# Patient Record
Sex: Male | Born: 1960 | Race: Black or African American | Hispanic: No | Marital: Married | Smoking: Never smoker
Health system: Southern US, Community
[De-identification: ages and names within clinical notes are randomized; demographics above are authoritative.]

## PROBLEM LIST (undated history)

## (undated) DIAGNOSIS — I10 Essential (primary) hypertension: Secondary | ICD-10-CM

## (undated) DIAGNOSIS — E119 Type 2 diabetes mellitus without complications: Secondary | ICD-10-CM

## (undated) DIAGNOSIS — E7800 Pure hypercholesterolemia, unspecified: Secondary | ICD-10-CM

## (undated) DIAGNOSIS — K469 Unspecified abdominal hernia without obstruction or gangrene: Secondary | ICD-10-CM

## (undated) HISTORY — DX: Unspecified abdominal hernia without obstruction or gangrene: K46.9

## (undated) HISTORY — PX: HERNIA REPAIR: SHX51

## (undated) HISTORY — PX: VASECTOMY: SHX75

## (undated) HISTORY — PX: ABDOMINAL SURGERY: SHX537

---

## 2012-07-02 ENCOUNTER — Encounter (INDEPENDENT_AMBULATORY_CARE_PROVIDER_SITE_OTHER): Payer: Self-pay

## 2012-07-03 ENCOUNTER — Ambulatory Visit (INDEPENDENT_AMBULATORY_CARE_PROVIDER_SITE_OTHER): Payer: Self-pay | Admitting: General Surgery

## 2012-07-29 ENCOUNTER — Ambulatory Visit (INDEPENDENT_AMBULATORY_CARE_PROVIDER_SITE_OTHER): Payer: BLUE CROSS/BLUE SHIELD | Admitting: General Surgery

## 2012-07-31 ENCOUNTER — Encounter (INDEPENDENT_AMBULATORY_CARE_PROVIDER_SITE_OTHER): Payer: Self-pay

## 2012-08-05 ENCOUNTER — Ambulatory Visit (INDEPENDENT_AMBULATORY_CARE_PROVIDER_SITE_OTHER): Payer: BLUE CROSS/BLUE SHIELD | Admitting: General Surgery

## 2012-08-05 ENCOUNTER — Encounter (INDEPENDENT_AMBULATORY_CARE_PROVIDER_SITE_OTHER): Payer: Self-pay | Admitting: General Surgery

## 2012-08-05 VITALS — BP 120/70 | HR 91 | Temp 98.1°F | Resp 18 | Ht 67.0 in | Wt 214.2 lb

## 2012-08-05 DIAGNOSIS — K430 Incisional hernia with obstruction, without gangrene: Secondary | ICD-10-CM

## 2012-08-05 NOTE — Patient Instructions (Signed)
The bulge in your abdomen is probably a recurrent, incarcerated incisional hernia. I cannot push it back in and so we need to x-ray this area to be sure that we understand the anatomy.  You will be scheduled for a CT scan of the abdomen and pelvis in the near future.  He will be given an appointment to return to see Dr. Derrell Lolling after the x-rays completed. We will discuss whether you need an operation at that time.    Hernia A hernia occurs when an internal organ pushes out through a weak spot in the abdominal wall. Hernias most commonly occur in the groin and around the navel. Hernias often can be pushed back into place (reduced). Most hernias tend to get worse over time. Some abdominal hernias can get stuck in the opening (irreducible or incarcerated hernia) and cannot be reduced. An irreducible abdominal hernia which is tightly squeezed into the opening is at risk for impaired blood supply (strangulated hernia). A strangulated hernia is a medical emergency. Because of the risk for an irreducible or strangulated hernia, surgery may be recommended to repair a hernia. CAUSES   Heavy lifting.  Prolonged coughing.  Straining to have a bowel movement.  A cut (incision) made during an abdominal surgery. HOME CARE INSTRUCTIONS   Bed rest is not required. You may continue your normal activities.  Avoid lifting more than 10 pounds (4.5 kg) or straining.  Cough gently. If you are a smoker it is best to stop. Even the best hernia repair can break down with the continual strain of coughing. Even if you do not have your hernia repaired, a cough will continue to aggravate the problem.  Do not wear anything tight over your hernia. Do not try to keep it in with an outside bandage or truss. These can damage abdominal contents if they are trapped within the hernia sac.  Eat a normal diet.  Avoid constipation. Straining over long periods of time will increase hernia size and encourage breakdown of  repairs. If you cannot do this with diet alone, stool softeners may be used. SEEK IMMEDIATE MEDICAL CARE IF:   You have a fever.  You develop increasing abdominal pain.  You feel nauseous or vomit.  Your hernia is stuck outside the abdomen, looks discolored, feels hard, or is tender.  You have any changes in your bowel habits or in the hernia that are unusual for you.  You have increased pain or swelling around the hernia.  You cannot push the hernia back in place by applying gentle pressure while lying down. MAKE SURE YOU:   Understand these instructions.  Will watch your condition.  Will get help right away if you are not doing well or get worse. Document Released: 04/22/2005 Document Revised: 07/15/2011 Document Reviewed: 12/10/2007 [facility] Patient Information 2013 [place], [place].

## 2012-08-05 NOTE — Progress Notes (Signed)
Patient ID: Joshua Harrington, male   DOB: 1960-05-09, 52 y.o.   MRN: [id number]  Chief Complaint  Patient presents with  . New Evaluation    eval hernia    HPI Joshua Harrington is a 52 y.o. male.  He is referred by Dr. Greggory Stallion Osei-Bonsu for evaluation of a bulge in the abdominal wall which is probably an incarcerated ventral hernia.  Approximately 15 years ago he underwent an open repair of an abdominal wall hernia with mesh. This was in the midline above the umbilicus. This was done in [place]. He is pretty sure that mesh was used. There are no other details.  He drives a truck for [employer]. He has noticed a baseball-sized bulge to the right of his midline incision for about one year, but denies pain and denies any history of enlarging.  Minimal comorbidities. Obesity. Had a colonoscopy a year ago. He has recently established himself with Dr. Julio Sicks. HPI  Past Medical History  Diagnosis Date  . Hernia     Past Surgical History  Procedure Laterality Date  . Abdominal surgery    . Hernia repair    . Vasectomy      Family History  Problem Relation Age of Onset  . Cancer Mother     Stomach  . Cirrhosis Father     Social History History  Substance Use Topics  . Smoking status: Never Smoker   . Smokeless tobacco: Never Used  . Alcohol Use: No    No Known Allergies  Current Outpatient Prescriptions  Medication Sig Dispense Refill  . fish oil-omega-3 fatty acids 1000 MG capsule Take 1 g by mouth daily. Take 4 daily       No current facility-administered medications for this visit.    Review of Systems Review of Systems  Constitutional: Negative for fever, chills and unexpected weight change.  HENT: Negative for hearing loss, congestion, sore throat, trouble swallowing and voice change.   Eyes: Negative for visual disturbance.  Respiratory: Negative for cough and wheezing.   Cardiovascular: Negative for chest pain, palpitations and leg swelling.  Gastrointestinal: Positive  for abdominal distention. Negative for nausea, vomiting, abdominal pain, diarrhea, constipation, blood in stool, anal bleeding and rectal pain.  Genitourinary: Negative for hematuria and difficulty urinating.  Musculoskeletal: Negative for arthralgias.  Skin: Negative for rash and wound.  Neurological: Negative for seizures, syncope, weakness and headaches.  Hematological: Negative for adenopathy. Does not bruise/bleed easily.  Psychiatric/Behavioral: Negative for confusion.    Blood pressure 120/70, pulse 91, temperature 98.1 F (36.7 C), temperature source Temporal, resp. rate 18, height 5\' 7"  (1.702 m), weight 214 lb 3.2 oz (97.16 kg).  Physical Exam Physical Exam  Constitutional: He is oriented to person, place, and time. He appears well-developed and well-nourished. No distress.  BMI 33.5  HENT:  Head: Normocephalic.  Nose: Nose normal.  Mouth/Throat: No oropharyngeal exudate.  Eyes: Conjunctivae and EOM are normal. Pupils are equal, round, and reactive to light. Right eye exhibits no discharge. Left eye exhibits no discharge. No scleral icterus.  Neck: Normal range of motion. Neck supple. No JVD present. No tracheal deviation present. No thyromegaly present.  Cardiovascular: Normal rate, regular rhythm, normal heart sounds and intact distal pulses.   No murmur heard. Pulmonary/Chest: Effort normal and breath sounds normal. No stridor. No respiratory distress. He has no wheezes. He has no rales. He exhibits no tenderness.  Abdominal: Soft. Bowel sounds are normal. He exhibits no distension and no mass. There is no tenderness.  There is no rebound and no guarding.    Baseball-sized bulge in the mid epigastrium to the right of a midline incision. Skin otherwise healthy. Nontender. Nonreducible. Umbilicus normal. No inguinal mass or hernia.  Musculoskeletal: Normal range of motion. He exhibits no edema and no tenderness.  Lymphadenopathy:    He has no cervical adenopathy.    Neurological: He is alert and oriented to person, place, and time. He has normal reflexes. Coordination normal.  Skin: Skin is warm and dry. No rash noted. He is not diaphoretic. No erythema. No pallor.  Psychiatric: He has a normal mood and affect. His behavior is normal. Judgment and thought content normal.    Data Reviewed Office notes from [facility]  Assessment    Abdominal wall mass, probable incarcerated, recurrent incisional hernia  History open repair epigastric hernia with mesh  Obesity     Plan    Because of the prior history of a mesh repair, and because this is not reducible, we will schedule for CT scan of abdomen and pelvis with contrast to better define the anatomy.  He was also advised to lose weight and to discuss that with his primary care physician.  Return to see me in 2 weeks at the CT scan is done. I told him that this may require another operation to repair the hernia.        Angelia Mould. Derrell Lolling, M.D., [facility] General and Minimally invasive Surgery Breast and Colorectal Surgery Office:   [phone] Pager:   [phone]  08/05/2012, 4:46 PM

## 2012-08-10 ENCOUNTER — Other Ambulatory Visit: Payer: Self-pay

## 2012-08-11 ENCOUNTER — Other Ambulatory Visit: Payer: Self-pay

## 2012-08-12 ENCOUNTER — Other Ambulatory Visit: Payer: Self-pay

## 2012-08-18 ENCOUNTER — Telehealth (INDEPENDENT_AMBULATORY_CARE_PROVIDER_SITE_OTHER): Payer: Self-pay

## 2012-08-18 NOTE — Telephone Encounter (Signed)
Facility calling requesting CT abd/pelvis order be faxed to them.  Pt has chosen [facility] instead of [facility].  Order was faxed and this CMA requested that results be faxed to Dr. Derrell Lolling, and the pt be given a CD to bring with him to his appt.

## 2012-08-24 ENCOUNTER — Encounter (INDEPENDENT_AMBULATORY_CARE_PROVIDER_SITE_OTHER): Payer: Self-pay

## 2012-08-27 ENCOUNTER — Encounter (INDEPENDENT_AMBULATORY_CARE_PROVIDER_SITE_OTHER): Payer: BLUE CROSS/BLUE SHIELD | Admitting: General Surgery

## 2012-09-16 ENCOUNTER — Ambulatory Visit (INDEPENDENT_AMBULATORY_CARE_PROVIDER_SITE_OTHER): Payer: BLUE CROSS/BLUE SHIELD | Admitting: General Surgery

## 2012-09-16 ENCOUNTER — Encounter (INDEPENDENT_AMBULATORY_CARE_PROVIDER_SITE_OTHER): Payer: Self-pay | Admitting: General Surgery

## 2012-09-16 VITALS — BP 130/82 | HR 60 | Temp 97.0°F | Resp 18 | Ht 67.0 in | Wt 213.0 lb

## 2012-09-16 DIAGNOSIS — K430 Incisional hernia with obstruction, without gangrene: Secondary | ICD-10-CM

## 2012-09-16 NOTE — Progress Notes (Signed)
Patient ID: Joshua Harrington, male   DOB: [birth year], 52 y.o.   MRN: [id number]  Chief Complaint  Patient presents with  . Follow-up    HPI [name] is a 52 y.o. male.  He returns to talk with me about his ventral hernia.  Recall that he was referred initially by Dr. Loletta Parish for evaluation of a bulge in the abdominal wall. 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 Denies pain nausea or vomiting. History is significant for an open repair of abdominal wall hernia with mesh 15 years ago in [place]. He doesn't really know the details of takes mesh was used. He had a colonoscopy a year ago. Mild obesity but otherwise no problems.  He had a CT scan at [facility] imaging recently. This shows a ventral wall hernia in the anterior abdominal wall to the right of the midline with herniation of omental fat. There is no bowel loop herniation there was no bowel function. There was a small cyst in the liver. I have reviewed the films on disc and the report.I have discussed this with him.  I have told him that there is no emergency, but that the hernia is incarcerated. I told him that it might begin to cause pain. I've told her that it might enlarge and at that the intestine might herniate into this at some point. He says that he would like to have this repaired electively before it gets much worse.  HPI  Past Medical History  Diagnosis Date  . Hernia     Past Surgical History  Procedure Laterality Date  . Abdominal surgery    . Hernia repair    . Vasectomy      Family History  Problem Relation Age of Onset  . Cancer Mother     Stomach  . Cirrhosis Father     Social History History  Substance Use Topics  . Smoking status: Never Smoker   . Smokeless tobacco: Never Used  . Alcohol Use: No    No Known Allergies  Current Outpatient Prescriptions  Medication Sig Dispense Refill  . fish  oil-omega-3 fatty acids 1000 MG capsule Take 1 g by mouth daily. Take 4 daily       No current facility-administered medications for this visit.    Review of Systems Review of Systems  Constitutional: Negative for fever, chills and unexpected weight change.  HENT: Negative for hearing loss, congestion, sore throat, trouble swallowing and voice change.   Eyes: Negative for visual disturbance.  Respiratory: Negative for cough and wheezing.   Cardiovascular: Negative for chest pain, palpitations and leg swelling.  Gastrointestinal: Negative for nausea, vomiting, abdominal pain, diarrhea, constipation, blood in stool, abdominal distention, anal bleeding and rectal pain.  Genitourinary: Negative for hematuria and difficulty urinating.  Musculoskeletal: Negative for arthralgias.  Skin: Negative for rash and wound.  Neurological: Negative for seizures, syncope, weakness and headaches.  Hematological: Negative for adenopathy. Does not bruise/bleed easily.  Psychiatric/Behavioral: Negative for confusion.    Blood pressure 130/82, pulse 60, temperature 97 F (36.1 C), temperature source Oral, resp. rate 18, height 5\' 7"  (1.702 m), weight 213 lb (96.616 kg).  Physical Exam Physical Exam  Constitutional: He is oriented to person, place, and time. He appears well-developed and well-nourished. No distress.  BMI 33.5  HENT:  Head: Normocephalic.  Nose: Nose normal.  Mouth/Throat: No oropharyngeal exudate.  Eyes: Conjunctivae and EOM are normal. Pupils are equal, round, and reactive to light. Right eye exhibits no discharge. Left eye exhibits no discharge. No scleral icterus.  Neck: Normal range of motion. Neck supple. No JVD present. No tracheal deviation present. No thyromegaly present.  Cardiovascular: Normal rate, regular rhythm, normal heart sounds and intact distal pulses.   No murmur heard. Pulmonary/Chest: Effort normal and breath sounds normal. No stridor. No respiratory distress. He has  no wheezes. He has no rales. He exhibits no tenderness.  Abdominal: Soft. Bowel sounds are normal. He exhibits no distension and no mass. There is no tenderness. There is no rebound and no guarding.    There is a bulge the size of a baseball lower larger in the mid epigastrium projecting to the right of the midline incision but probably related to the midline incision. This is smooth. Not tender particularly. I have tried forcably  to reduce this but cannot reduce it.  Musculoskeletal: Normal range of motion. He exhibits no edema and no tenderness.  Lymphadenopathy:    He has no cervical adenopathy.  Neurological: He is alert and oriented to person, place, and time. He has normal reflexes. Coordination normal.  Skin: Skin is warm and dry. No rash noted. He is not diaphoretic. No erythema. No pallor.  Psychiatric: He has a normal mood and affect. His behavior is normal. Judgment and thought content normal.    Data Reviewed My records. Recent CT scan  Assessment    Incarcerated, recurrent incisional hernia.  Obesity  History open repair epigastric hernia with mesh, details unknown     Plan    The patient requested we proceed with scheduling of repair and I think that is appropriate.  He willscheduled for open repair of recurrent incisional hernia with mesh in the near future. This may or may not require component separation advancement flaps.  I have discussed indications, details, techniques, and numerous risks of the surgery with him. I have given her patient information booklet with this information as well and have reviewed the images and the risks with him. He understands all these issues. All his questions were answered. He agrees with this plan.        Angelia Mould. Derrell Lolling, M.D., [facility] General and Minimally invasive Surgery Breast and Colorectal Surgery Office:   [phone] Pager:   [phone]  09/16/2012, 2:33 PM

## 2012-09-16 NOTE — Patient Instructions (Signed)
Your CT scan confirms that you have a recurrent incisional hernia containing fatty tissue and omentum. There is no intestine trapped in there at this time.  We have discussed different options for management of this. You have decided to go ahead and have an operation to repair this at this time. That is appropriate.  You will be scheduled for open repair of incarcerated recurrent incisional hernia with mesh in the near future.

## 2012-09-23 ENCOUNTER — Encounter (INDEPENDENT_AMBULATORY_CARE_PROVIDER_SITE_OTHER): Payer: Self-pay

## 2014-11-21 ENCOUNTER — Encounter (HOSPITAL_BASED_OUTPATIENT_CLINIC_OR_DEPARTMENT_OTHER): Payer: Self-pay | Admitting: *Deleted

## 2014-11-21 ENCOUNTER — Emergency Department (HOSPITAL_BASED_OUTPATIENT_CLINIC_OR_DEPARTMENT_OTHER)
Admission: EM | Admit: 2014-11-21 | Discharge: 2014-11-21 | Disposition: A | Discharge status: Home / Self Care | Payer: BLUE CROSS/BLUE SHIELD | Attending: Emergency Medicine | Admitting: Emergency Medicine

## 2014-11-21 DIAGNOSIS — M545 Low back pain, unspecified: Secondary | ICD-10-CM

## 2014-11-21 DIAGNOSIS — Z8719 Personal history of other diseases of the digestive system: Secondary | ICD-10-CM | POA: Diagnosis not present

## 2014-11-21 DIAGNOSIS — Z79899 Other long term (current) drug therapy: Secondary | ICD-10-CM | POA: Insufficient documentation

## 2014-11-21 DIAGNOSIS — E119 Type 2 diabetes mellitus without complications: Secondary | ICD-10-CM | POA: Insufficient documentation

## 2014-11-21 DIAGNOSIS — M62830 Muscle spasm of back: Secondary | ICD-10-CM | POA: Diagnosis not present

## 2014-11-21 DIAGNOSIS — M549 Dorsalgia, unspecified: Secondary | ICD-10-CM | POA: Diagnosis present

## 2014-11-21 DIAGNOSIS — E780 Pure hypercholesterolemia: Secondary | ICD-10-CM | POA: Diagnosis not present

## 2014-11-21 HISTORY — DX: Type 2 diabetes mellitus without complications: E11.9

## 2014-11-21 HISTORY — DX: Pure hypercholesterolemia, unspecified: E78.00

## 2014-11-21 MED ORDER — HYDROMORPHONE HCL 1 MG/ML IJ SOLN
1.0000 mg | Freq: Once | INTRAMUSCULAR | Status: AC
  Administered 2014-11-21: 1 mg via INTRAMUSCULAR
  Filled 2014-11-21: qty 1

## 2014-11-21 MED ORDER — HYDROCODONE-ACETAMINOPHEN 5-325 MG PO TABS: 1.0000 | ORAL_TABLET | Freq: Four times a day (QID) | ORAL | Status: DC | PRN

## 2014-11-21 MED ORDER — CYCLOBENZAPRINE HCL 10 MG PO TABS
10.0000 mg | ORAL_TABLET | Freq: Once | ORAL | Status: AC
  Administered 2014-11-21: 10 mg via ORAL
  Filled 2014-11-21: qty 1

## 2014-11-21 MED ORDER — IBUPROFEN 800 MG PO TABS: 800.0000 mg | ORAL_TABLET | Freq: Three times a day (TID) | ORAL | Status: DC

## 2014-11-21 NOTE — Discharge Instructions (Signed)
Back Injury Prevention °Back injuries can be extremely painful and difficult to heal. After having one back injury, you are much more likely to experience another later on. It is important to learn how to avoid injuring or re-injuring your back. The following tips can help you to prevent a back injury. °PHYSICAL FITNESS °· Exercise regularly and try to develop good tone in your abdominal muscles. Your abdominal muscles provide a lot of the support needed by your back. °· Do aerobic exercises (walking, jogging, biking, swimming) regularly. °· Do exercises that increase balance and strength (tai chi, yoga) regularly. This can decrease your risk of falling and injuring your back. °· Stretch before and after exercising. °· Maintain a healthy weight. The more you weigh, the more stress is placed on your back. For every pound of weight, 10 times that amount of pressure is placed on the back. °DIET °· Talk to your caregiver about how much calcium and vitamin D you need per day. These nutrients help to prevent weakening of the bones (osteoporosis). Osteoporosis can cause broken (fractured) bones that lead to back pain. °· Include good sources of calcium in your diet, such as dairy products, green, leafy vegetables, and products with calcium added (fortified). °· Include good sources of vitamin D in your diet, such as milk and foods that are fortified with vitamin D. °· Consider taking a nutritional supplement or a multivitamin if needed. °· Stop smoking if you smoke. °POSTURE °· Sit and stand up straight. Avoid leaning forward when you sit or hunching over when you stand. °· Choose chairs with good low back (lumbar) support. °· If you work at a desk, sit close to your work so you do not need to lean over. Keep your chin tucked in. Keep your neck drawn back and elbows bent at a right angle. Your arms should look like the letter "L." °· Sit high and close to the steering wheel when you drive. Add a lumbar support to your car  seat if needed. °· Avoid sitting or standing in one position for too long. Take breaks to get up, stretch, and walk around at least once every hour. Take breaks if you are driving for long periods of time. °· Sleep on your side with your knees slightly bent, or sleep on your back with a pillow under your knees. Do not sleep on your stomach. °LIFTING, TWISTING, AND REACHING °· Avoid heavy lifting, especially repetitive lifting. If you must do heavy lifting: °· Stretch before lifting. °· Work slowly. °· Rest between lifts. °· Use carts and dollies to move objects when possible. °· Make several small trips instead of carrying 1 heavy load. °· Ask for help when you need it. °· Ask for help when moving big, awkward objects. °· Follow these steps when lifting: °· Stand with your feet shoulder-width apart. °· Get as close to the object as you can. Do not try to pick up heavy objects that are far from your body. °· Use handles or lifting straps if they are available. °· Bend at your knees. Squat down, but keep your heels off the floor. °· Keep your shoulders pulled back, your chin tucked in, and your back straight. °· Lift the object slowly, tightening the muscles in your legs, abdomen, and buttocks. Keep the object as close to the center of your body as possible. °· When you put a load down, use these same guidelines in reverse. °· Do not: °· Lift the object above your waist. °·   Twist at the waist while lifting or carrying a load. Move your feet if you need to turn, not your waist.  Bend over without bending at your knees.  Avoid reaching over your head, across a table, or for an object on a high surface. OTHER TIPS  Avoid wet floors and keep sidewalks clear of ice to prevent falls.  Do not sleep on a mattress that is too soft or too hard.  Keep items that are used frequently within easy reach.  Put heavier objects on shelves at waist level and lighter objects on lower or higher shelves.  Find ways to  decrease your stress, such as exercise, massage, or relaxation techniques. Stress can build up in your muscles. Tense muscles are more vulnerable to injury.  Seek treatment for depression or anxiety if needed. These conditions can increase your risk of developing back pain. SEEK MEDICAL CARE IF:  You injure your back.  You have questions about diet, exercise, or other ways to prevent back injuries. MAKE SURE YOU:  Understand these instructions.  Will watch your condition.  Will get help right away if you are not doing well or get worse. Document Released: 05/30/2004 Document Revised: 07/15/2011 Document Reviewed: 06/03/2011 [employer] Patient Information 2015 [employer]. This information is not intended to replace advice given to you by your health care provider. Make sure you discuss any questions you have with your health care provider.  Back Pain, Adult Low back pain is very common. About 1 in 5 people have back pain.The cause of low back pain is rarely dangerous. The pain often gets better over time.About half of people with a sudden onset of back pain feel better in just 2 weeks. About 8 in 10 people feel better by 6 weeks.  CAUSES Some common causes of back pain include:  Strain of the muscles or ligaments supporting the spine.  Wear and tear (degeneration) of the spinal discs.  Arthritis.  Direct injury to the back. DIAGNOSIS Most of the time, the direct cause of low back pain is not known.However, back pain can be treated effectively even when the exact cause of the pain is unknown.Answering your caregiver's questions about your overall health and symptoms is one of the most accurate ways to make sure the cause of your pain is not dangerous. If your caregiver needs more information, he or she may order lab work or imaging tests (X-rays or MRIs).However, even if imaging tests show changes in your back, this usually does not require surgery. HOME CARE INSTRUCTIONS For  many people, back pain returns.Since low back pain is rarely dangerous, it is often a condition that people can learn to manageon their own.   Remain active. It is stressful on the back to sit or stand in one place. Do not sit, drive, or stand in one place for more than 30 minutes at a time. Take short walks on level surfaces as soon as pain allows.Try to increase the length of time you walk each day.  Do not stay in bed.Resting more than 1 or 2 days can delay your recovery.  Do not avoid exercise or work.Your body is made to move.It is not dangerous to be active, even though your back may hurt.Your back will likely heal faster if you return to being active before your pain is gone.  Pay attention to your body when you bend and lift. Many people have less discomfortwhen lifting if they bend their knees, keep the load close to their bodies,and   avoid twisting. Often, the most comfortable positions are those that put less stress on your recovering back.  Find a comfortable position to sleep. Use a firm mattress and lie on your side with your knees slightly bent. If you lie on your back, put a pillow under your knees.  Only take over-the-counter or prescription medicines as directed by your caregiver. Over-the-counter medicines to reduce pain and inflammation are often the most helpful.Your caregiver may prescribe muscle relaxant drugs.These medicines help dull your pain so you can more quickly return to your normal activities and healthy exercise.  Put ice on the injured area.  Put ice in a plastic bag.  Place a towel between your skin and the bag.  Leave the ice on for 15-20 minutes, 03-04 times a day for the first 2 to 3 days. After that, ice and heat may be alternated to reduce pain and spasms.  Ask your caregiver about trying back exercises and gentle massage. This may be of some benefit.  Avoid feeling anxious or stressed.Stress increases muscle tension and can worsen back  pain.It is important to recognize when you are anxious or stressed and learn ways to manage it.Exercise is a great option. SEEK MEDICAL CARE IF:  You have pain that is not relieved with rest or medicine.  You have pain that does not improve in 1 week.  You have new symptoms.  You are generally not feeling well. SEEK IMMEDIATE MEDICAL CARE IF:   You have pain that radiates from your back into your legs.  You develop new bowel or bladder control problems.  You have unusual weakness or numbness in your arms or legs.  You develop nausea or vomiting.  You develop abdominal pain.  You feel faint. Document Released: 04/22/2005 Document Revised: 10/22/2011 Document Reviewed: 08/24/2013 [facility] Patient Information 2015 [place], [place]. This information is not intended to replace advice given to you by your health care provider. Make sure you discuss any questions you have with your health care provider.  Musculoskeletal Pain Musculoskeletal pain is muscle and boney aches and pains. These pains can occur in any part of the body. Your caregiver may treat you without knowing the cause of the pain. They may treat you if blood or urine tests, X-rays, and other tests were normal.  CAUSES There is often not a definite cause or reason for these pains. These pains may be caused by a type of germ (virus). The discomfort may also come from overuse. Overuse includes working out too hard when your body is not fit. Boney aches also come from weather changes. Bone is sensitive to atmospheric pressure changes. HOME CARE INSTRUCTIONS   Ask when your test results will be ready. Make sure you get your test results.  Only take over-the-counter or prescription medicines for pain, discomfort, or fever as directed by your caregiver. If you were given medications for your condition, do not drive, operate machinery or power tools, or sign legal documents for 24 hours. Do not drink alcohol. Do not take sleeping  pills or other medications that may interfere with treatment.  Continue all activities unless the activities cause more pain. When the pain lessens, slowly resume normal activities. Gradually increase the intensity and duration of the activities or exercise.  During periods of severe pain, bed rest may be helpful. Lay or sit in any position that is comfortable.  Putting ice on the injured area.  Put ice in a bag.  Place a towel between your skin and the bag.  Leave the ice on for 15 to 20 minutes, 3 to 4 times a day.  Follow up with your caregiver for continued problems and no reason can be found for the pain. If the pain becomes worse or does not go away, it may be necessary to repeat tests or do additional testing. Your caregiver may need to look further for a possible cause. SEEK IMMEDIATE MEDICAL CARE IF:  You have pain that is getting worse and is not relieved by medications.  You develop chest pain that is associated with shortness or breath, sweating, feeling sick to your stomach (nauseous), or throw up (vomit).  Your pain becomes localized to the abdomen.  You develop any new symptoms that seem different or that concern you. MAKE SURE YOU:   Understand these instructions.  Will watch your condition.  Will get help right away if you are not doing well or get worse. Document Released: 04/22/2005 Document Revised: 07/15/2011 Document Reviewed: 12/25/2012 [facility] Patient Information 2015 [place], [place]. This information is not intended to replace advice given to you by your health care provider. Make sure you discuss any questions you have with your health care provider.

## 2014-11-21 NOTE — ED Provider Notes (Signed)
CSN: [id number]   Arrival date & time 11/21/14 1321  History  This chart was scribed for  Elwin MochaBlair Samirah Scarpati, MD by Bethel BornBritney McCollum, ED Scribe. This patient was seen in room MH02/MH02 and the patient's care was started at 2:59 PM.  Chief Complaint  Patient presents with  . Back Pain    HPI Patient is a 54 y.o. male presenting with back pain. The history is provided by the patient. No language interpreter was used.  Back Pain Location:  Lumbar spine Radiates to:  Does not radiate Pain severity:  Severe Onset quality:  Sudden Timing:  Constant Progression:  Worsening Context: recent injury   Relieved by:  Nothing Worsened by:  Movement and bending Associated symptoms: no bladder incontinence, no bowel incontinence, no fever, no tingling and no weakness    Joshua Harrington is a 54 y.o. male who presents to the Emergency Department complaining of constant lower back pain with sudden onset yesterday while changing a tire. Pt notes feeling like he pulled a muscle. He rates the pain 9/10 in severity. Fast movement and bending exacerbate the pain. Aleve provided insufficient pain relief PTA. Pt denies bowel or bladder incontinence, numbness or weakness in the lower extremities, and fever. He is having no difficulty ambulating.   Past Medical History  Diagnosis Date  . Hernia   . Diabetes mellitus without complication   . High cholesterol     Past Surgical History  Procedure Laterality Date  . Abdominal surgery    . Hernia repair    . Vasectomy      Family History  Problem Relation Age of Onset  . Cancer Mother     Stomach  . Cirrhosis Father     History  Substance Use Topics  . Smoking status: Never Smoker   . Smokeless tobacco: Never Used  . Alcohol Use: No     Review of Systems  Constitutional: Negative for fever.  Gastrointestinal: Negative for bowel incontinence.  Genitourinary: Negative for bladder incontinence.  Musculoskeletal: Positive for back pain.  Neurological: Negative  for tingling and weakness.  All other systems reviewed and are negative.   Home Medications   Prior to Admission medications   Medication Sig Start Date End Date Taking? Authorizing Provider  atorvastatin (LIPITOR) 10 MG tablet Take 10 mg by mouth daily.   Yes Historical Provider, MD  METFORMIN HCL PO Take by mouth.   Yes Historical Provider, MD  fish oil-omega-3 fatty acids 1000 MG capsule Take 1 g by mouth daily. Take 4 daily    Historical Provider, MD    Allergies  Review of patient's allergies indicates no known allergies.  Triage Vitals: BP 138/83 mmHg  Pulse 88  Temp(Src) 98.3 F (36.8 C) (Oral)  Resp 16  Ht 5\' 7"  (1.702 m)  Wt 207 lb (93.895 kg)  BMI 32.41 kg/m2  SpO2 99%  Physical Exam  Constitutional: He is oriented to person, place, and time. He appears well-developed and well-nourished. No distress.  HENT:  Head: Normocephalic and atraumatic.  Mouth/Throat: No oropharyngeal exudate.  Eyes: EOM are normal. Pupils are equal, round, and reactive to light.  Neck: Normal range of motion. Neck supple.  Cardiovascular: Normal rate and regular rhythm.  Exam reveals no friction rub.   No murmur heard. Pulmonary/Chest: Effort normal and breath sounds normal. No respiratory distress. He has no wheezes. He has no rales.  Abdominal: He exhibits no distension. There is no tenderness. There is no rebound.  Musculoskeletal: Normal range of motion. He  exhibits no edema.       Lumbar back: He exhibits spasm (Right lower lumbar).  Neurological: He is alert and oriented to person, place, and time.  Skin: He is not diaphoretic.  Nursing note and vitals reviewed.   ED Course  Procedures   DIAGNOSTIC STUDIES: Oxygen Saturation is 99% on RA, normal by my interpretation.    COORDINATION OF CARE: 3:02 PM Discussed treatment plan which includes Dilaudid and Flexeril with pt at bedside and pt agreed to plan.  Labs Review- Labs Reviewed - No data to display  Imaging Review No  results found.  EKG Interpretation None      MDM   Final diagnoses:  Right-sided low back pain without sciatica    54 year old male here with right lower back pain. Constant since yesterday after changing a tire. No fevers, red flags such as bowel incontinence, bladder incontinence, weakness, numbness, fever. He is well appearing. Here he has a right lower lumbar spasm noted on exam. Pain does not radiate. No belly pain. Vitals are stable. Exam and clinical history consistent with right lower lumbar strain. We'll give Dilaudid and Flexeril here. Plan for discharge with prescription for Vicodin and Flexeril. Feeling better after pain meds. Stable for discharge.    I personally performed the services described in this documentation, which was scribed in my presence. The recorded information has been reviewed and is accurate.     Elwin Mocha, MD 11/21/14 1535

## 2014-11-21 NOTE — ED Notes (Signed)
Lower back pain since changing a tire yesterday.

## 2018-03-18 ENCOUNTER — Encounter: Payer: Self-pay | Admitting: Podiatry

## 2018-03-18 ENCOUNTER — Ambulatory Visit (INDEPENDENT_AMBULATORY_CARE_PROVIDER_SITE_OTHER): Payer: Managed Care, Other (non HMO) | Admitting: Podiatry

## 2018-03-18 VITALS — BP 150/101

## 2018-03-18 DIAGNOSIS — B351 Tinea unguium: Secondary | ICD-10-CM | POA: Diagnosis not present

## 2018-03-18 DIAGNOSIS — M79674 Pain in right toe(s): Secondary | ICD-10-CM | POA: Diagnosis not present

## 2018-03-18 DIAGNOSIS — B353 Tinea pedis: Secondary | ICD-10-CM

## 2018-03-18 DIAGNOSIS — M79675 Pain in left toe(s): Secondary | ICD-10-CM | POA: Diagnosis not present

## 2018-03-18 DIAGNOSIS — Z9109 Other allergy status, other than to drugs and biological substances: Secondary | ICD-10-CM | POA: Insufficient documentation

## 2018-03-18 MED ORDER — KETOCONAZOLE 2 % EX CREA: 1.0000 "application " | TOPICAL_CREAM | Freq: Every day | CUTANEOUS | 1 refills | Status: DC

## 2018-03-18 NOTE — Patient Instructions (Addendum)
Athlete's Foot Athlete's foot (tinea pedis) is a fungal infection of the skin on the feet. It often occurs on the skin that is between or underneath the toes. It can also occur on the soles of the feet. The infection can spread from person to person (is contagious). What are the causes? Athlete's foot is caused by a fungus. This fungus grows in warm, moist places. Most people get athlete's foot by sharing shower stalls, towels, and wet floors with someone who is infected. Not washing your feet or changing your socks often enough can contribute to athlete's foot. What increases the risk? This condition is more likely to develop in:  Men.  People who have a weak body defense system (immune system).  People who have diabetes.  People who use public showers, such as at a gym.  People who wear heavy-duty shoes, such as industrial or military shoes.  Seasons with warm, humid weather.  What are the signs or symptoms? Symptoms of this condition include:  Itchy areas between the toes or on the soles of the feet.  White, flaky, or scaly areas between the toes or on the soles of the feet.  Very itchy small blisters between the toes or on the soles of the feet.  Small cuts on the skin. These cuts can become infected.  Thick or discolored toenails.  How is this diagnosed? This condition is diagnosed with a medical history and physical exam. Your health care provider may also take a skin or toenail sample to be examined. How is this treated? Treatment for this condition includes antifungal medicines. These may be applied as powders, ointments, or creams. In severe cases, an oral antifungal medicine may be given. Follow these instructions at home:  Apply or take over-the-counter and prescription medicines only as told by your health care provider.  Keep all follow-up visits as told by your health care provider. This is important.  Do not scratch your feet.  Keep your feet dry: ? Wear  cotton or wool socks. Change your socks every day or if they become wet. ? Wear shoes that allow air to circulate, such as sandals or canvas tennis shoes.  Wash and dry your feet: ? Every day or as told by your health care provider. ? After exercising. ? Including the area between your toes.  Do not share towels, nail clippers, or other personal items that touch your feet with others.  If you have diabetes, keep your blood sugar under control. How is this prevented?  Do not share towels.  Wear sandals in wet areas, such as locker rooms and shared showers.  Keep your feet dry: ? Wear cotton or wool socks. Change your socks every day or if they become wet. ? Wear shoes that allow air to circulate, such as sandals or canvas tennis shoes.  Wash and dry your feet after exercising. Pay attention to the area between your toes. Contact a health care provider if:  You have a fever.  You have swelling, soreness, warmth, or redness in your foot.  You are not getting better with treatment.  Your symptoms get worse.  You have new symptoms. This information is not intended to replace advice given to you by your health care provider. Make sure you discuss any questions you have with your health care provider. Document Released: 04/19/2000 Document Revised: 09/28/2015 Document Reviewed: 10/24/2014 [employer] Interactive Patient Education  2018 [employer] Diabetes and Foot Care Diabetes may cause you to have problems because of poor   blood supply (circulation) to your feet and legs. This may cause the skin on your feet to become thinner, break easier, and heal more slowly. Your skin may become dry, and the skin may peel and crack. You may also have nerve damage in your legs and feet causing decreased feeling in them. You may not notice minor injuries to your feet that could lead to infections or more serious problems. Taking care of your feet is one of the most important things you can do for  yourself. Follow these instructions at home:  Wear shoes at all times, even in the house. Do not go barefoot. Bare feet are easily injured.  Check your feet daily for blisters, cuts, and redness. If you cannot see the bottom of your feet, use a mirror or ask someone for help.  Wash your feet with warm water (do not use hot water) and mild soap. Then pat your feet and the areas between your toes until they are completely dry. Do not soak your feet as this can dry your skin.  Apply a moisturizing lotion or petroleum jelly (that does not contain alcohol and is unscented) to the skin on your feet and to dry, brittle toenails. Do not apply lotion between your toes.  Trim your toenails straight across. Do not dig under them or around the cuticle. File the edges of your nails with an emery board or nail file.  Do not cut corns or calluses or try to remove them with medicine.  Wear clean socks or stockings every day. Make sure they are not too tight. Do not wear knee-high stockings since they may decrease blood flow to your legs.  Wear shoes that fit properly and have enough cushioning. To break in new shoes, wear them for just a few hours a day. This prevents you from injuring your feet. Always look in your shoes before you put them on to be sure there are no objects inside.  Do not cross your legs. This may decrease the blood flow to your feet.  If you find a minor scrape, cut, or break in the skin on your feet, keep it and the skin around it clean and dry. These areas may be cleansed with mild soap and water. Do not cleanse the area with peroxide, alcohol, or iodine.  When you remove an adhesive bandage, be sure not to damage the skin around it.  If you have a wound, look at it several times a day to make sure it is healing.  Do not use heating pads or hot water bottles. They may burn your skin. If you have lost feeling in your feet or legs, you may not know it is happening until it is too  late.  Make sure your health care provider performs a complete foot exam at least annually or more often if you have foot problems. Report any cuts, sores, or bruises to your health care provider immediately. Contact a health care provider if:  You have an injury that is not healing.  You have cuts or breaks in the skin.  You have an ingrown nail.  You notice redness on your legs or feet.  You feel burning or tingling in your legs or feet.  You have pain or cramps in your legs and feet.  Your legs or feet are numb.  Your feet always feel cold. Get help right away if:  There is increasing redness, swelling, or pain in or around a wound.  There is a   red line that goes up your leg.  Pus is coming from a wound.  You develop a fever or as directed by your health care provider.  You notice a bad smell coming from an ulcer or wound. This information is not intended to replace advice given to you by your health care provider. Make sure you discuss any questions you have with your health care provider. Document Released: 04/19/2000 Document Revised: 09/28/2015 Document Reviewed: 09/29/2012 [employer] Interactive Patient Education  2017 [employer]  

## 2018-04-12 NOTE — Progress Notes (Signed)
Subjective: Joshua Harrington presents today referred by [name], MD with diabetes and cc of painful, discolored, thick toenails which interfere with daily activities. Duration is greater than one month.  Pain is aggravated when wearing enclosed shoe gear.  Past Medical History:  Diagnosis Date  . Diabetes mellitus without complication (HCC)   . Hernia   . High cholesterol     Patient Active Problem List   Diagnosis Date Noted  . Environmental allergies 03/18/2018  . Recurrent incisional hernia with incarceration 08/05/2012    Past Surgical History:  Procedure Laterality Date  . ABDOMINAL SURGERY    . HERNIA REPAIR    . VASECTOMY      atorvastatin (LIPITOR) 10 MG tablet    fish oil-omega-3 fatty acids 1000 MG capsule    ibuprofen (ADVIL,MOTRIN) 800 MG tablet    METFORMIN HCL PO    HYDROcodone-acetaminophen (NORCO/VICODIN) 5-325 MG per tablet     No Known Allergies  Social History   Occupational History  . Not on file  Tobacco Use  . Smoking status: Never Smoker  . Smokeless tobacco: Never Used  Substance and Sexual Activity  . Alcohol use: No  . Drug use: No  . Sexual activity: Not on file    Family History  Problem Relation Age of Onset  . Cancer Mother        Stomach  . Cirrhosis Father      There is no immunization history on file for this patient.   Review of systems: Positive Findings in bold print.  Constitutional:  chills, fatigue, fever, sweats, weight change Communication: Nurse, learning disability, sign Presenter, broadcasting, hand writing, iPad/Android device Eyes: diplopia, glare,  light sensitivity, eyeglasses, blindness Ears nose mouth throat: Hard of hearing, deaf, sign language,  vertigo,  bloody nose,  rhinitis,  cold sores, snoring Cardiovascular: HTN, edema, arrhythmia, pacemaker in place, defibrillator in place,  chest pain/tightness, chronic anticoagulation, blood clot Respiratory:  difficulty breathing, denies congestion, SOB, wheezing,  cough Gastrointestinal: abdominal pain, diarrhea, nausea, vomiting,  Genitourinary:  nocturia,  pain on urination,  blood in urine, Foley catheter, urinary urgency Musculoskeletal: Uses mobility aid,  cramping, stiff joints, painful joints,  Skin: +changes in toenails, color change dryness, itchy skin, mole changes, or rash  Neurological: numbness, paresthesias, burning in feet, denies fainting,  seizure, change in speech. denies headaches, memory problems/poor historian, cerebral palsy Endocrine: diabetes, hypothyroidism, hyperthyroidism,  dry mouth, flushing, denies heat intolerance,  cold intolerance,  excessive thirst, denies polyuria,  nocturia Hematological:  easy bleeding,  excessive bleeding, easy bruising, enlarged lymph nodes, on long term blood thinner Allergy/immunological:  hive, frequent infections, multiple drug allergies, seasonal allergies,  Psychiatric:  anxiety, depression, mood disorder, suicidal ideations, hallucinations   Objective: Vascular Examination: Capillary refill time immediate x 10 digits Dorsalis pedis and posterior tibial pulses present b/l Digital hair sparse x 10 digits Skin temperature gradient WNL b/l  Dermatological Examination: Skin with normal turgor, texture and tone b/l  Toenails 1-5 b/l discolored, thick, dystrophic with subungual debris and pain with palpation to nailbeds due to thickness of nails.  Diffuse scaling noted peripherally and plantarly b/l feet with mild foot odor.  No interdigital macerations.  No blisters, no weeping. No signs of secondary bacterial infection noted.  Musculoskeletal: Muscle strength 5/5 to all LE muscle groups  Neurological: Sensation intact with 10 gram monofilament Vibratory sensation intact.  Assessment: 1. Painful onychomycosis toenails 1-5 b/l  2. NIDDM  Plan: 1. Discussed diabetic foot care principles. Literature dispensed on today. 2.  Nail clippings were taken today for fungal culture. 3. Toenails  1-5 b/l were debrided in length and girth without iatrogenic bleeding. For tinea pedis, prescription sent to pharmacy for Ketoconazole Cream 2% to be applied to both feet and between toes qd x 6 weeks. 4. Patient to continue soft, supportive shoe gear 5. Patient to report any pedal injuries to medical professional immediately. 6. Follow up 3 months. Patient/POA to call should there be a concern in the interim.

## 2018-06-17 ENCOUNTER — Ambulatory Visit (INDEPENDENT_AMBULATORY_CARE_PROVIDER_SITE_OTHER): Payer: Managed Care, Other (non HMO) | Admitting: Podiatry

## 2018-06-17 DIAGNOSIS — M79674 Pain in right toe(s): Secondary | ICD-10-CM

## 2018-06-17 DIAGNOSIS — M79675 Pain in left toe(s): Secondary | ICD-10-CM

## 2018-06-17 DIAGNOSIS — B351 Tinea unguium: Secondary | ICD-10-CM

## 2018-06-17 NOTE — Patient Instructions (Signed)

## 2018-06-22 ENCOUNTER — Encounter: Payer: Self-pay | Admitting: Podiatry

## 2018-06-22 NOTE — Progress Notes (Signed)
Subjective: Joshua Harrington presents today with painful, thick toenails 1-5 b/l that he cannot cut and which interfere with daily activities.  Pain is aggravated when wearing enclosed shoe gear.  Joshua Plum, MD is his PCP.   He states his blood sugar was 103 mg/dl this morning.   Current Outpatient Medications:  .  atorvastatin (LIPITOR) 10 MG tablet, Take 10 mg by mouth daily., Disp: , Rfl:  .  fish oil-omega-3 fatty acids 1000 MG capsule, Take 1 g by mouth daily. Take 4 daily, Disp: , Rfl:  .  HYDROcodone-acetaminophen (NORCO/VICODIN) 5-325 MG per tablet, Take 1 tablet by mouth every 6 (six) hours as needed for moderate pain. (Patient not taking: Reported on 03/18/2018), Disp: 20 tablet, Rfl: 0 .  ibuprofen (ADVIL,MOTRIN) 800 MG tablet, Take 1 tablet (800 mg total) by mouth 3 (three) times daily., Disp: 21 tablet, Rfl: 0 .  ketoconazole (NIZORAL) 2 % cream, Apply 1 application topically daily. Apply to both feet and between toes once daily for 6 weeks, Disp: 30 g, Rfl: 1 .  METFORMIN HCL PO, Take by mouth., Disp: , Rfl:   No Known Allergies  Objective:  Vascular Examination: Capillary refill time immediate x 10 digits  Dorsalis pedis and Posterior tibial pulses palpable b/l  Digital hair sparse x 10 digits  Skin temperature gradient WNL b/l  Dermatological Examination: Skin with normal turgor, texture and tone b/l  Toenails 1-5 b/l discolored, thick, dystrophic with subungual debris and pain with palpation to nailbeds due to thickness of nails.  Musculoskeletal: Muscle strength 5/5 to all LE muscle groups  Neurological: Sensation intact with 10 gram monofilament. Vibratory sensation intact.  Assessment: Painful onychomycosis toenails 1-5 b/l   Plan: 1. Toenails 1-5 b/l were debrided in length and girth without iatrogenic bleeding. 2. Patient to continue soft, supportive shoe gear 3. Patient to report any pedal injuries to medical professional  immediately. 4. Follow up 3 months.  5. Patient/POA to call should there be a concern in the interim.

## 2018-09-16 ENCOUNTER — Ambulatory Visit: Payer: Managed Care, Other (non HMO) | Admitting: Podiatry

## 2018-10-28 ENCOUNTER — Ambulatory Visit: Payer: Managed Care, Other (non HMO) | Admitting: Podiatry

## 2018-11-25 ENCOUNTER — Ambulatory Visit: Payer: Managed Care, Other (non HMO) | Admitting: Podiatry

## 2019-08-21 ENCOUNTER — Ambulatory Visit: Payer: BLUE CROSS/BLUE SHIELD

## 2019-08-21 ENCOUNTER — Ambulatory Visit: Payer: Self-pay | Attending: Internal Medicine

## 2019-08-21 DIAGNOSIS — Z23 Encounter for immunization: Secondary | ICD-10-CM

## 2019-08-21 NOTE — Progress Notes (Signed)
   Covid-19 Vaccination Clinic  Name:  Joshua Harrington    MRN: [id number] DOB: 07-11-1960  08/21/2019  [name] was observed post Covid-19 immunization for 15 minutes without incident. He was provided with Vaccine Information Sheet and instruction to access the V-Safe system.   [name] was instructed to call 911 with any severe reactions post vaccine: Marland Kitchen Difficulty breathing  . Swelling of face and throat  . A fast heartbeat  . A bad rash all over body  . Dizziness and weakness   Immunizations Administered    Name Date Dose VIS Date Route   Pfizer COVID-19 Vaccine 08/21/2019  9:19 AM 0.3 mL 04/16/2019 Intramuscular   Manufacturer: [employer], [employer]   Lot: [id number]   NDC: [address]-5

## 2019-09-13 ENCOUNTER — Ambulatory Visit: Payer: Self-pay | Attending: Internal Medicine

## 2019-09-13 DIAGNOSIS — Z23 Encounter for immunization: Secondary | ICD-10-CM

## 2019-09-13 NOTE — Progress Notes (Signed)
   Covid-19 Vaccination Clinic  Name:  Joshua Harrington    MRN: [id number] DOB: 1960/07/02  09/13/2019  [name] was observed post Covid-19 immunization for 15 minutes without incident. He was provided with Vaccine Information Sheet and instruction to access the V-Safe system.   [name] was instructed to call 911 with any severe reactions post vaccine: Marland Kitchen Difficulty breathing  . Swelling of face and throat  . A fast heartbeat  . A bad rash all over body  . Dizziness and weakness   Immunizations Administered    Name Date Dose VIS Date Route   Pfizer COVID-19 Vaccine 09/13/2019  9:37 AM 0.3 mL 06/30/2018 Intramuscular   Manufacturer: [employer], [employer]   Lot: [id number]   NDC: [address]-0

## 2020-03-03 ENCOUNTER — Other Ambulatory Visit: Payer: Self-pay

## 2020-03-03 ENCOUNTER — Ambulatory Visit: Payer: Managed Care, Other (non HMO) | Admitting: Podiatry

## 2020-03-03 ENCOUNTER — Encounter: Payer: Self-pay | Admitting: Podiatry

## 2020-03-03 DIAGNOSIS — M79675 Pain in left toe(s): Secondary | ICD-10-CM | POA: Diagnosis not present

## 2020-03-03 DIAGNOSIS — B351 Tinea unguium: Secondary | ICD-10-CM

## 2020-03-03 DIAGNOSIS — M79674 Pain in right toe(s): Secondary | ICD-10-CM | POA: Diagnosis not present

## 2020-03-05 NOTE — Progress Notes (Signed)
Subjective:  Patient ID: Joshua Harrington, male    DOB: 1961/03/17,  MRN: [id number]  59 y.o. male presents with painful thick toenails that are difficult to trim. Pain interferes with ambulation. Aggravating factors include wearing enclosed shoe gear. Pain is relieved with periodic professional debridement..    Review of Systems: Negative except as noted in the HPI.  Past Medical History:  Diagnosis Date  . Diabetes mellitus without complication (HCC)   . Hernia   . High cholesterol    Past Surgical History:  Procedure Laterality Date  . ABDOMINAL SURGERY    . HERNIA REPAIR    . VASECTOMY     Patient Active Problem List   Diagnosis Date Noted  . Environmental allergies 03/18/2018  . Recurrent incisional hernia with incarceration 08/05/2012    Current Outpatient Medications:  .  atorvastatin (LIPITOR) 10 MG tablet, Take 10 mg by mouth daily., Disp: , Rfl:  .  fish oil-omega-3 fatty acids 1000 MG capsule, Take 1 g by mouth daily. Take 4 daily, Disp: , Rfl:  .  gabapentin (NEURONTIN) 300 MG capsule, Take 300 mg by mouth 2 (two) times daily., Disp: , Rfl:  .  HYDROcodone-acetaminophen (NORCO/VICODIN) 5-325 MG per tablet, Take 1 tablet by mouth every 6 (six) hours as needed for moderate pain., Disp: 20 tablet, Rfl: 0 .  ibuprofen (ADVIL,MOTRIN) 800 MG tablet, Take 1 tablet (800 mg total) by mouth 3 (three) times daily., Disp: 21 tablet, Rfl: 0 .  ketoconazole (NIZORAL) 2 % cream, Apply 1 application topically daily. Apply to both feet and between toes once daily for 6 weeks, Disp: 30 g, Rfl: 1 .  metFORMIN (GLUCOPHAGE) 500 MG tablet, Take 500 mg by mouth 2 (two) times daily., Disp: , Rfl:  .  METFORMIN HCL PO, Take by mouth., Disp: , Rfl:  No Known Allergies Social History   Occupational History  . Not on file  Tobacco Use  . Smoking status: Never Smoker  . Smokeless tobacco: Never Used  Substance and Sexual Activity  . Alcohol use: No  . Drug use: No  . Sexual activity: Not on  file    Objective:   Constitutional Pt is a pleasant 59 y.o. African American male WD, WN in NAD. AAO x 3.   Vascular Capillary refill time to digits immediate b/l. Palpable pedal pulses b/l LE. Pedal hair sparse. Lower extremity skin temperature gradient within normal limits. No pain with calf compression b/l. No edema noted b/l lower extremities. No cyanosis or clubbing noted.  Neurologic Normal speech. Protective sensation intact 5/5 intact bilaterally with 10g monofilament b/l.  Dermatologic Pedal skin with normal turgor, texture and tone bilaterally. No open wounds bilaterally. No interdigital macerations bilaterally. Toenails 1-5 b/l elongated, discolored, dystrophic, thickened, crumbly with subungual debris and tenderness to dorsal palpation.  Orthopedic: Normal muscle strength 5/5 to all lower extremity muscle groups bilaterally. No pain crepitus or joint limitation noted with ROM b/l. No gross bony deformities bilaterally. Patient ambulates independent of any assistive aids.   Radiographs: None Assessment:  No diagnosis found. Plan:  Patient was evaluated and treated and all questions answered.  Onychomycosis with pain -Nails palliatively debridement as below. -Educated on self-care  Procedure: Nail Debridement Rationale: Pain Type of Debridement: manual, sharp debridement. Instrumentation: Nail nipper, rotary burr. Number of Nails: 10  -Examined patient. -No new findings. No new orders. -Continue diabetic foot care principles. -Toenails 1-5 b/l were debrided in length and girth with sterile nail nippers and dremel without iatrogenic bleeding.  -  Patient to report any pedal injuries to medical professional immediately. -Patient to continue soft, supportive shoe gear daily. -Patient/POA to call should there be question/concern in the interim.  Return in about 3 months (around 06/03/2020) for diabetic toenails.  Freddie Breech, DPM

## 2020-06-13 ENCOUNTER — Encounter: Payer: Self-pay | Admitting: Podiatry

## 2020-06-13 ENCOUNTER — Ambulatory Visit (INDEPENDENT_AMBULATORY_CARE_PROVIDER_SITE_OTHER): Payer: 59 | Admitting: Podiatry

## 2020-06-13 ENCOUNTER — Other Ambulatory Visit: Payer: Self-pay

## 2020-06-13 DIAGNOSIS — E1165 Type 2 diabetes mellitus with hyperglycemia: Secondary | ICD-10-CM | POA: Insufficient documentation

## 2020-06-13 DIAGNOSIS — E782 Mixed hyperlipidemia: Secondary | ICD-10-CM | POA: Insufficient documentation

## 2020-06-13 DIAGNOSIS — B351 Tinea unguium: Secondary | ICD-10-CM

## 2020-06-13 DIAGNOSIS — M79675 Pain in left toe(s): Secondary | ICD-10-CM | POA: Diagnosis not present

## 2020-06-13 DIAGNOSIS — M2011 Hallux valgus (acquired), right foot: Secondary | ICD-10-CM | POA: Diagnosis not present

## 2020-06-13 DIAGNOSIS — M79674 Pain in right toe(s): Secondary | ICD-10-CM | POA: Diagnosis not present

## 2020-06-13 DIAGNOSIS — M5430 Sciatica, unspecified side: Secondary | ICD-10-CM | POA: Insufficient documentation

## 2020-06-13 DIAGNOSIS — E119 Type 2 diabetes mellitus without complications: Secondary | ICD-10-CM | POA: Diagnosis not present

## 2020-06-13 DIAGNOSIS — S90415A Abrasion, left lesser toe(s), initial encounter: Secondary | ICD-10-CM

## 2020-06-13 DIAGNOSIS — E291 Testicular hypofunction: Secondary | ICD-10-CM | POA: Insufficient documentation

## 2020-06-13 DIAGNOSIS — L84 Corns and callosities: Secondary | ICD-10-CM

## 2020-06-13 DIAGNOSIS — M2012 Hallux valgus (acquired), left foot: Secondary | ICD-10-CM

## 2020-06-13 DIAGNOSIS — E559 Vitamin D deficiency, unspecified: Secondary | ICD-10-CM | POA: Insufficient documentation

## 2020-06-13 NOTE — Progress Notes (Signed)
ANNUAL DIABETIC FOOT EXAM  Subjective: Joshua Harrington presents today for for annual diabetic foot examination and painful thick toenails that are difficult to trim. Pain interferes with ambulation. Aggravating factors include wearing enclosed shoe gear. Pain is relieved with periodic professional debridement..  Patient relates 5 year h/o diabetes.  Patient denies h/o foot wounds.  Patient denies symptoms of foot numbness.  Patient denies symptoms of foot tingling.  Patient denies symptoms of burning in feet.  Patient's blood sugar was 158 mg/dl on June 07, 2020.  [name], MD is patient's PCP. Last visit was  June 07, 2020.  Past Medical History:  Diagnosis Date  . Diabetes mellitus without complication (HCC)   . Hernia   . High cholesterol    Patient Active Problem List   Diagnosis Date Noted  . Hyperglycemia due to type 2 diabetes mellitus (HCC) 06/13/2020  . Male hypogonadism 06/13/2020  . Mixed hyperlipidemia 06/13/2020  . Sciatica 06/13/2020  . Vitamin D deficiency 06/13/2020  . Environmental allergies 03/18/2018  . Recurrent incisional hernia with incarceration 08/05/2012   Past Surgical History:  Procedure Laterality Date  . ABDOMINAL SURGERY    . HERNIA REPAIR    . VASECTOMY     Current Outpatient Medications on File Prior to Visit  Medication Sig Dispense Refill  . atorvastatin (LIPITOR) 10 MG tablet Take 10 mg by mouth daily.    . Cholecalciferol (VITAMIN D3) 125 MCG (5000 UT) CAPS Take 1 capsule by mouth daily.    . fish oil-omega-3 fatty acids 1000 MG capsule Take 1 g by mouth daily. Take 4 daily    . gabapentin (NEURONTIN) 300 MG capsule Take 300 mg by mouth 2 (two) times daily.    Marland Kitchen HYDROcodone-acetaminophen (NORCO/VICODIN) 5-325 MG per tablet Take 1 tablet by mouth every 6 (six) hours as needed for moderate pain. 20 tablet 0  . ibuprofen (ADVIL,MOTRIN) 800 MG tablet Take 1 tablet (800 mg total) by mouth 3 (three) times daily. 21 tablet 0  .  ketoconazole (NIZORAL) 2 % cream Apply 1 application topically daily. Apply to both feet and between toes once daily for 6 weeks 30 g 1  . metFORMIN (GLUCOPHAGE) 500 MG tablet Take 500 mg by mouth 2 (two) times daily.    Marland Kitchen METFORMIN HCL PO Take by mouth.    . Omega-3 Fatty Acids (FISH OIL) 1000 MG CAPS 4    . Testosterone 25 MG/2.5GM (1%) GEL 1 pkt(s)     No current facility-administered medications on file prior to visit.    No Known Allergies Social History   Occupational History  . Not on file  Tobacco Use  . Smoking status: Never Smoker  . Smokeless tobacco: Never Used  Substance and Sexual Activity  . Alcohol use: No  . Drug use: No  . Sexual activity: Not on file   Family History  Problem Relation Age of Onset  . Cancer Mother        Stomach  . Cirrhosis Father    Immunization History  Administered Date(s) Administered  . PFIZER(Purple Top)SARS-COV-2 Vaccination 08/21/2019, 09/13/2019     Review of Systems: Negative except as noted in the HPI.  Objective: There were no vitals filed for this visit.  Joshua Harrington is a pleasant 60 y.o. male in NAD. AAO X 3.  Vascular Examination: Capillary refill time to digits immediate b/l. Palpable pedal pulses b/l LE. Pedal hair present. Lower extremity skin temperature gradient within normal limits. No pain with calf compression b/l. No edema  noted b/l lower extremities.  Dermatological Examination: Pedal skin with normal turgor, texture and tone bilaterally. No open wounds bilaterally. No interdigital macerations bilaterally. Toenails 1-5 b/l elongated, discolored, dystrophic, thickened, crumbly with subungual debris and tenderness to dorsal palpation. Healing abrasion noted left 2nd digit. No erythema, no edema, no drainage, no fluctuance. Hyperkeratotic lesion(s) submet head 5 left foot.  No erythema, no edema, no drainage, no fluctuance.  Musculoskeletal Examination: Normal muscle strength 5/5 to all lower extremity muscle  groups bilaterally. No pain crepitus or joint limitation noted with ROM b/l. Hallux valgus with bunion deformity noted b/l lower extremities. Hammertoes noted to the L 2nd toe and R 2nd toe.  Footwear Assessment: Does the patient wear appropriate shoes? Yes. Does the patient need inserts/orthotics? Yes.  Neurological Examination: Protective sensation intact 5/5 intact bilaterally with 10g monofilament b/l. Vibratory sensation intact b/l.  Assessment: 1. Pain due to onychomycosis of toenails of both feet   2. Callus   3. Hallux valgus, acquired, bilateral   4. Abrasion of second toe of left foot, initial encounter   5. Controlled type 2 diabetes mellitus without complication, without long-term current use of insulin (HCC)   6. Encounter for diabetic foot exam (HCC)     ADA Risk Categorization: Low Risk :  Patient has all of the following: Intact protective sensation No prior foot ulcer  No severe deformity Pedal pulses present  Plan: -Examined patient. -Diabetic foot examination performed on today's visit. -Continue diabetic foot care principles. -Patient to continue soft, supportive shoe gear daily. -Toenails 1-5 b/l were debrided in length and girth with sterile nail nippers and dremel without iatrogenic bleeding.  -Callus(es) submet head 5 left foot pared utilizing sterile scalpel blade without complication or incident. Total number debrided =1. -Patient to report any pedal injuries to medical professional immediately. -Abrasion left 2nd digit is healing. Monitor.. -Patient/POA to call should there be question/concern in the interim.  Return in about 3 months (around 09/10/2020).  Freddie Breech, DPM

## 2020-09-25 ENCOUNTER — Ambulatory Visit: Payer: 59 | Admitting: Podiatry

## 2021-05-24 ENCOUNTER — Other Ambulatory Visit: Payer: Self-pay | Admitting: Internal Medicine

## 2021-05-24 DIAGNOSIS — I639 Cerebral infarction, unspecified: Secondary | ICD-10-CM

## 2021-05-28 ENCOUNTER — Other Ambulatory Visit: Payer: Self-pay

## 2021-05-28 ENCOUNTER — Encounter (HOSPITAL_BASED_OUTPATIENT_CLINIC_OR_DEPARTMENT_OTHER): Payer: Self-pay | Admitting: *Deleted

## 2021-05-28 ENCOUNTER — Emergency Department (HOSPITAL_BASED_OUTPATIENT_CLINIC_OR_DEPARTMENT_OTHER)
Admission: EM | Admit: 2021-05-28 | Discharge: 2021-05-28 | Disposition: A | Discharge status: Home / Self Care | Payer: Commercial Managed Care - HMO | Attending: Emergency Medicine | Admitting: Emergency Medicine

## 2021-05-28 ENCOUNTER — Emergency Department (HOSPITAL_COMMUNITY): Payer: Commercial Managed Care - HMO

## 2021-05-28 ENCOUNTER — Emergency Department (HOSPITAL_BASED_OUTPATIENT_CLINIC_OR_DEPARTMENT_OTHER): Payer: Commercial Managed Care - HMO

## 2021-05-28 DIAGNOSIS — Z20822 Contact with and (suspected) exposure to covid-19: Secondary | ICD-10-CM | POA: Insufficient documentation

## 2021-05-28 DIAGNOSIS — Z7984 Long term (current) use of oral hypoglycemic drugs: Secondary | ICD-10-CM | POA: Diagnosis not present

## 2021-05-28 DIAGNOSIS — R531 Weakness: Secondary | ICD-10-CM | POA: Insufficient documentation

## 2021-05-28 DIAGNOSIS — E119 Type 2 diabetes mellitus without complications: Secondary | ICD-10-CM | POA: Insufficient documentation

## 2021-05-28 DIAGNOSIS — Z79899 Other long term (current) drug therapy: Secondary | ICD-10-CM | POA: Insufficient documentation

## 2021-05-28 LAB — URINALYSIS, ROUTINE W REFLEX MICROSCOPIC
Bilirubin Urine: NEGATIVE
Glucose, UA: NEGATIVE mg/dL
Hgb urine dipstick: NEGATIVE
Ketones, ur: NEGATIVE mg/dL
Leukocytes,Ua: NEGATIVE
Nitrite: NEGATIVE
Protein, ur: NEGATIVE mg/dL
Specific Gravity, Urine: 1.015 (ref 1.005–1.030)
pH: 6 (ref 5.0–8.0)

## 2021-05-28 LAB — RAPID URINE DRUG SCREEN, HOSP PERFORMED
Amphetamines: NOT DETECTED
Barbiturates: NOT DETECTED
Benzodiazepines: NOT DETECTED
Cocaine: NOT DETECTED
Opiates: NOT DETECTED
Tetrahydrocannabinol: NOT DETECTED

## 2021-05-28 LAB — DIFFERENTIAL
Abs Immature Granulocytes: 0.02 10*3/uL (ref 0.00–0.07)
Basophils Absolute: 0.1 10*3/uL (ref 0.0–0.1)
Basophils Relative: 1 %
Eosinophils Absolute: 0.1 10*3/uL (ref 0.0–0.5)
Eosinophils Relative: 2 %
Immature Granulocytes: 0 %
Lymphocytes Relative: 31 %
Lymphs Abs: 2.6 10*3/uL (ref 0.7–4.0)
Monocytes Absolute: 0.5 10*3/uL (ref 0.1–1.0)
Monocytes Relative: 6 %
Neutro Abs: 5 10*3/uL (ref 1.7–7.7)
Neutrophils Relative %: 60 %

## 2021-05-28 LAB — CBC
HCT: 39.9 % (ref 39.0–52.0)
Hemoglobin: 12.5 g/dL — ABNORMAL LOW (ref 13.0–17.0)
MCH: 25.6 pg — ABNORMAL LOW (ref 26.0–34.0)
MCHC: 31.3 g/dL (ref 30.0–36.0)
MCV: 81.6 fL (ref 80.0–100.0)
Platelets: 289 10*3/uL (ref 150–400)
RBC: 4.89 MIL/uL (ref 4.22–5.81)
RDW: 15 % (ref 11.5–15.5)
WBC: 8.2 10*3/uL (ref 4.0–10.5)
nRBC: 0 % (ref 0.0–0.2)

## 2021-05-28 LAB — ETHANOL: Alcohol, Ethyl (B): <10 mg/dL (ref <10)

## 2021-05-28 LAB — COMPREHENSIVE METABOLIC PANEL
ALT: 13 U/L (ref 0–44)
AST: 16 U/L (ref 15–41)
Albumin: 3.7 g/dL (ref 3.5–5.0)
Alkaline Phosphatase: 71 U/L (ref 38–126)
Anion gap: 8 (ref 5–15)
BUN: 16 mg/dL (ref 8–23)
CO2: 27 mmol/L (ref 22–32)
Calcium: 9.2 mg/dL (ref 8.9–10.3)
Chloride: 102 mmol/L (ref 98–111)
Creatinine, Ser: 0.79 mg/dL (ref 0.61–1.24)
GFR, Estimated: >60 mL/min (ref >60)
Glucose, Bld: 142 mg/dL — ABNORMAL HIGH (ref 70–99)
Potassium: 3.8 mmol/L (ref 3.5–5.1)
Sodium: 137 mmol/L (ref 135–145)
Total Bilirubin: 0.5 mg/dL (ref 0.3–1.2)
Total Protein: 7.6 g/dL (ref 6.5–8.1)

## 2021-05-28 LAB — RESP PANEL BY RT-PCR (FLU A&B, COVID) ARPGX2
Influenza A by PCR: NEGATIVE
Influenza B by PCR: NEGATIVE
SARS Coronavirus 2 by RT PCR: NEGATIVE

## 2021-05-28 LAB — PROTIME-INR
INR: 1 (ref 0.8–1.2)
Prothrombin Time: 13.2 seconds (ref 11.4–15.2)

## 2021-05-28 IMAGING — MR MR HEAD W/O CM
12 of 13 series · 44 of 48 positions shown · non-contrast
Comparison: Head CT [DATE]

CLINICAL DATA: Neuro deficit, acute, stroke suspected. Left arm and
leg weakness.

EXAM:
MRI HEAD WITHOUT CONTRAST
TECHNIQUE: Multiplanar, multiecho pulse sequences of the brain and surrounding
structures were obtained without intravenous contrast.

[Series 5: DWI · axial · 3.0mm · 0.92mm/px · z∈[-113,+33]mm · 7 of 100 slices shown (1 of 4)]
[im 1/100]
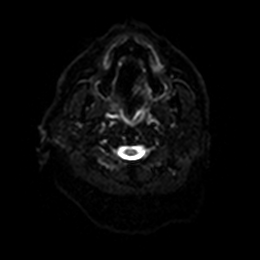
[im 17/100]
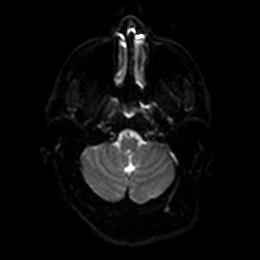
[im 34/100]
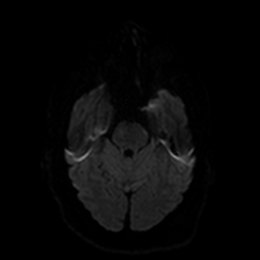
[im 50/100]
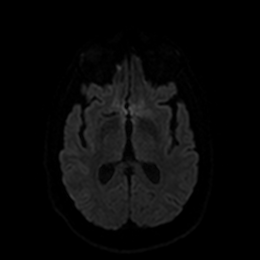
[im 67/100]
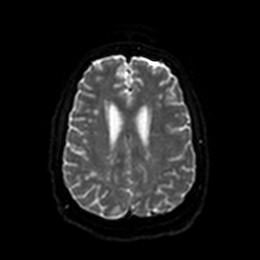
[im 83/100]
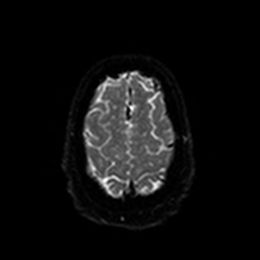
[im 100/100]
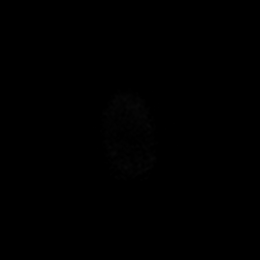

[Series 6: DWI · axial · 3.0mm · 0.92mm/px · z∈[-113,+33]mm · 4 of 50 slices shown (2 of 4)]
[im 1/50]
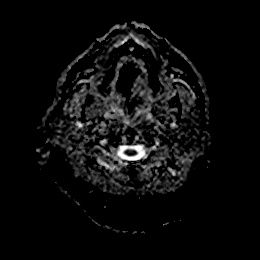
[im 17/50]
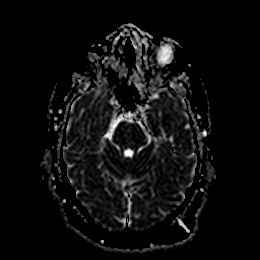
[im 33/50]
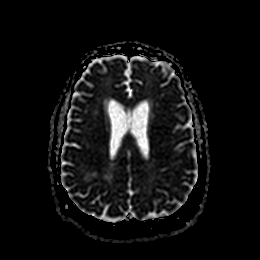
[im 50/50]
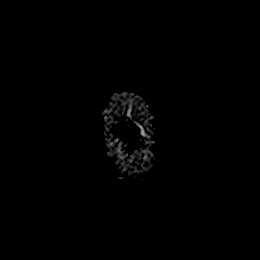

[Series 7: DWI · coronal · 4.0mm · 0.88mm/px · 6 of 76 slices shown (3 of 4)]
[im 1/76]
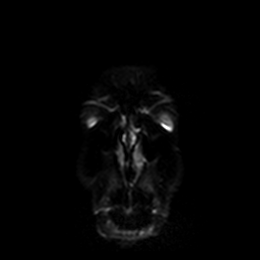
[im 16/76]
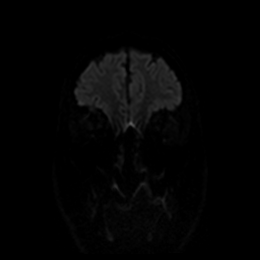
[im 31/76]
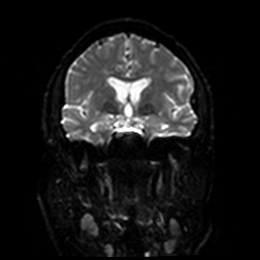
[im 46/76]
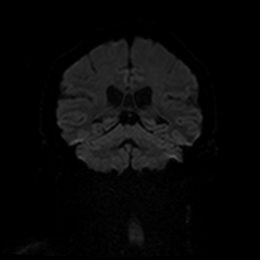
[im 61/76]
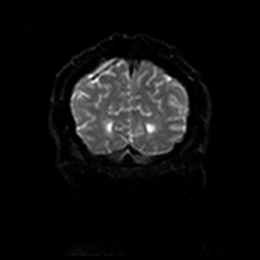
[im 76/76]
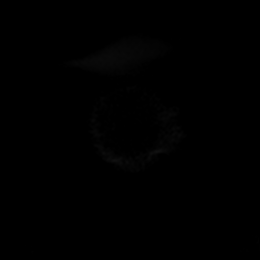

[Series 8: DWI · coronal · 4.0mm · 0.88mm/px · 3 of 38 slices shown (4 of 4)]
[im 1/38]
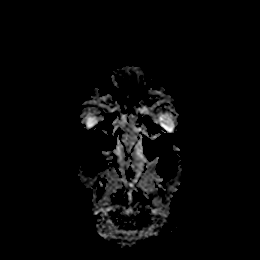
[im 19/38]
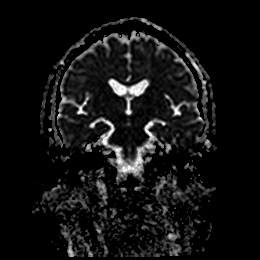
[im 38/38]
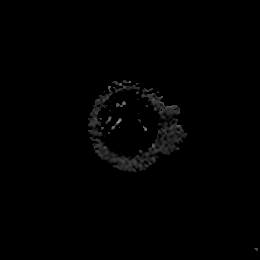

[Series 9: T1 · sagittal · 5.0mm · 0.75mm/px · 2 of 25 slices shown]
[im 1/25]
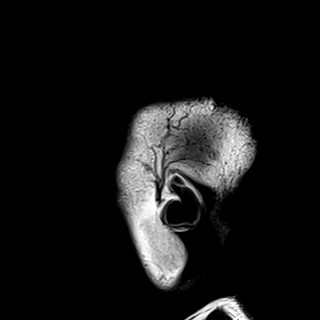
[im 25/25]
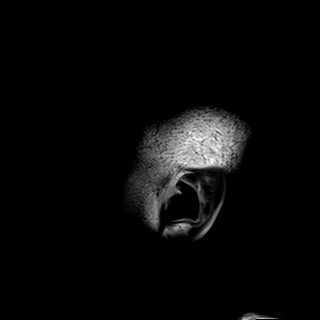

[Series 10: T2 · axial · 5.0mm · 0.75mm/px · z∈[-119,+30]mm · 2 of 26 slices shown (1 of 2)]
[im 1/26]
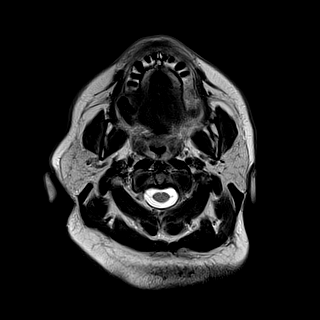
[im 26/26]
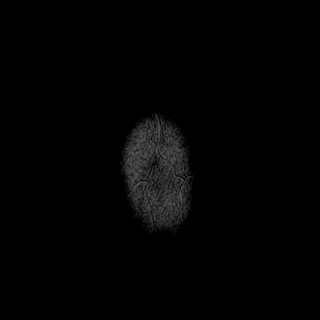

[Series 11: FLAIR · axial · 5.0mm · 0.45mm/px · z∈[-118,+31]mm · 2 of 26 slices shown]
[im 1/26]
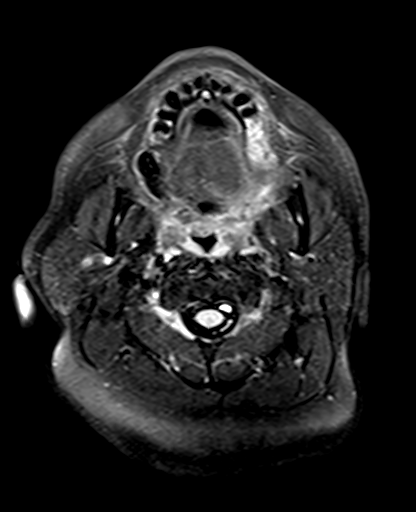
[im 26/26]
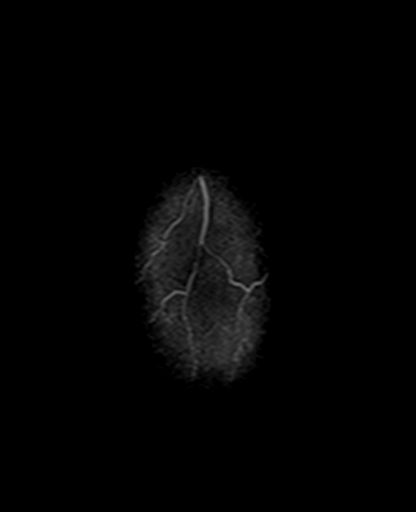

[Series 12: mag_images · axial · 3.0mm · 0.94mm/px · z∈[-132,+44]mm · 4 of 60 slices shown]
[im 1/60]
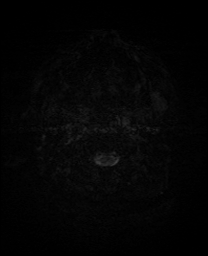
[im 20/60]
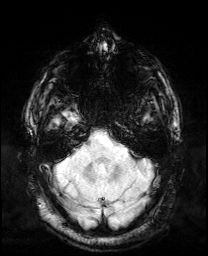
[im 40/60]
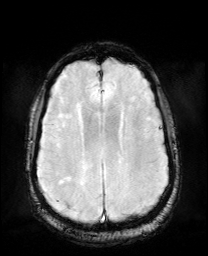
[im 60/60]
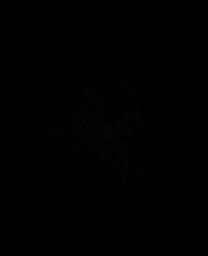

[Series 13: pha_images · axial · 3.0mm · 0.94mm/px · z∈[-129,+35]mm · 4 of 56 slices shown]
[im 1/56]
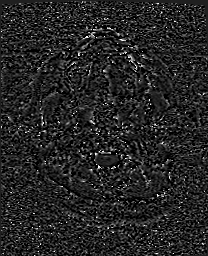
[im 19/56]
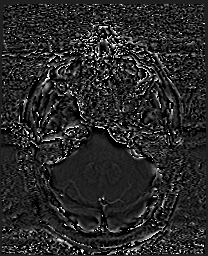
[im 37/56]
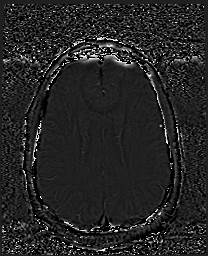
[im 56/56]
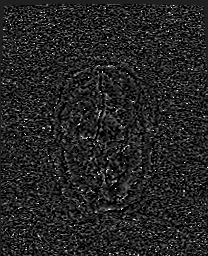

[Series 14: swi_images · axial · 3.0mm · 0.94mm/px · z∈[-132,+44]mm · 4 of 60 slices shown]
[im 1/60]
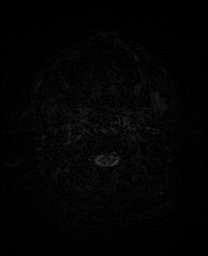
[im 20/60]
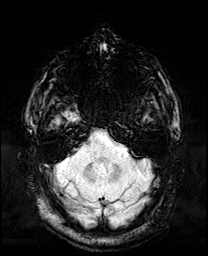
[im 40/60]
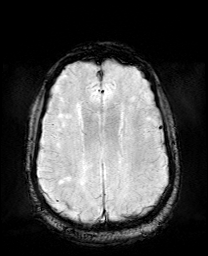
[im 60/60]
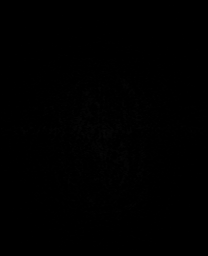

[Series 15: mip_images(sw) · axial · 24.0mm · 0.94mm/px · z∈[-122,+34]mm · 4 of 53 slices shown]
[im 1/53]
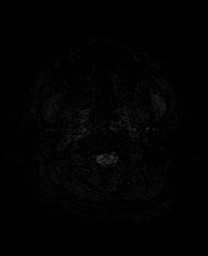
[im 18/53]
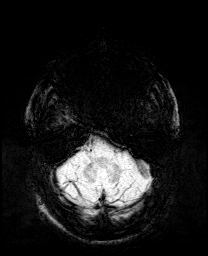
[im 35/53]
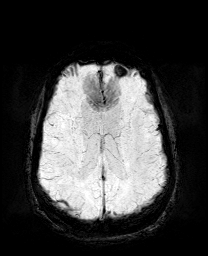
[im 53/53]
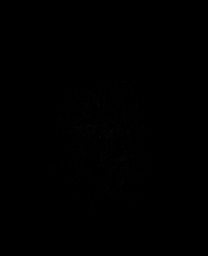

[Series 17: T2 · coronal · 5.0mm · 0.34mm/px · 2 of 32 slices shown (2 of 2)]
[im 1/32]
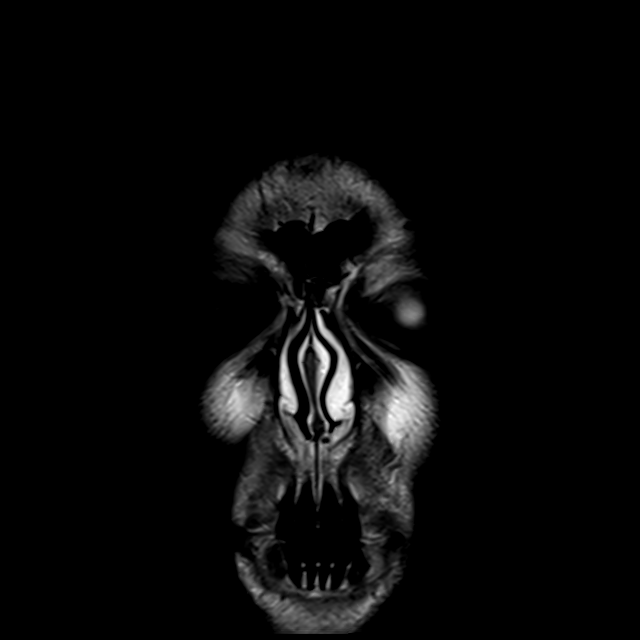
[im 32/32]
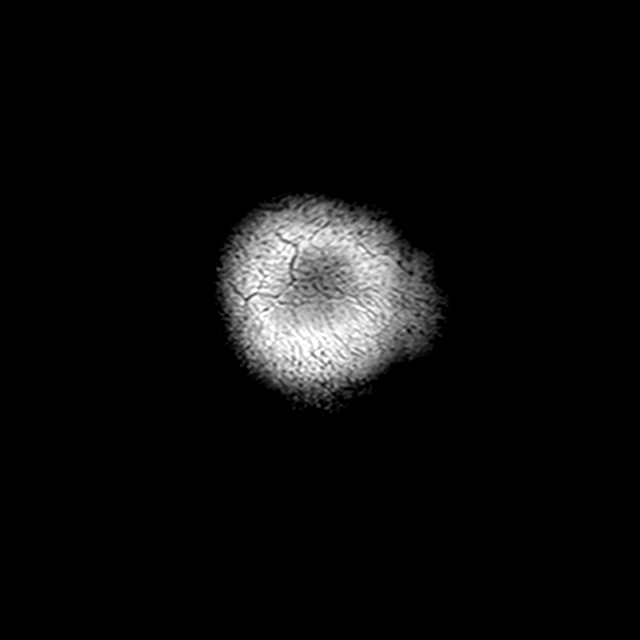

[44 of 48 positions shown; findings below may reference images not displayed]

FINDINGS: Brain: There is an acute right paramedian pontine infarct. No
intracranial hemorrhage, mass, midline shift, or extra-axial fluid
collection is identified. Small T2 hyperintensities in the cerebral
white matter bilaterally are nonspecific but compatible with
mild-to-moderate chronic small vessel ischemic disease. The
ventricles and sulci are within normal limits for age.

Vascular: Abnormal appearance of the distal right vertebral artery.

Skull and upper cervical spine: Unremarkable bone marrow signal.

Sinuses/Orbits: Unremarkable orbits. Paranasal sinuses and mastoid
air cells are clear.

Other: None.
IMPRESSION: 1. Acute right pontine infarct.
2. Mild-to-moderate chronic small vessel ischemic disease.
3. Abnormal appearance of the distal right vertebral artery which
may reflect slow flow or occlusion. Consider head and neck CTA or
MRA for further evaluation.

## 2021-05-28 MED ORDER — SODIUM CHLORIDE 0.9 % IV SOLN
100.0000 mL/h | INTRAVENOUS | Status: DC
  Administered 2021-05-28: 100 mL/h via INTRAVENOUS

## 2021-05-28 MED ORDER — SODIUM CHLORIDE 0.9 % IV BOLUS
500.0000 mL | Freq: Once | INTRAVENOUS | Status: AC
  Administered 2021-05-28: 500 mL via INTRAVENOUS

## 2021-05-28 NOTE — ED Notes (Signed)
Pt requesting to have IV removed prior to transfer.

## 2021-05-28 NOTE — ED Triage Notes (Addendum)
Weakness in his left arm and leg for a week. He is ambulatory, alert oriented. It started after working out the gym.

## 2021-05-28 NOTE — ED Notes (Signed)
ED Charge Notified that Pt was coming POV to [facility] ED.  Dr Criss Alvine accepting.  Pt is stable for transfer.

## 2021-05-28 NOTE — Discharge Instructions (Signed)
You need to go to [facility] Emergency Room right now for a MRI of your brain.

## 2021-05-28 NOTE — ED Provider Notes (Addendum)
[facility] EMERGENCY DEPARTMENT Provider Note   CSN: [id number] Arrival date & time: 05/28/21  1106     History  Chief Complaint  Patient presents with   Weakness    Joshua Harrington is a 61 y.o. male.  Pt is a 61 yo bm with a hx of high cholesterol and dm.  Pt said he developed left arm and leg weakness on 1/14.  Pt did see his pcp on 1/19 for these sx.  His doctor told him to go to the ED to get evaluated for a stroke.  He said his wife is disabled and he had to go home.  He said he has everything situated today, so he came in today.  Pt denies any trouble with walking or talking or vision.  He's been able to drive.      Home Medications Prior to Admission medications   Medication Sig Start Date End Date Taking? Authorizing Provider  atorvastatin (LIPITOR) 10 MG tablet Take 10 mg by mouth daily.    [provider]  Cholecalciferol (VITAMIN D3) 125 MCG (5000 UT) CAPS Take 1 capsule by mouth daily.    [provider]  fish oil-omega-3 fatty acids 1000 MG capsule Take 1 g by mouth daily. Take 4 daily    [provider]  gabapentin (NEURONTIN) 300 MG capsule Take 300 mg by mouth 2 (two) times daily. 02/18/20   [provider]  HYDROcodone-acetaminophen (NORCO/VICODIN) 5-325 MG per tablet Take 1 tablet by mouth every 6 (six) hours as needed for moderate pain. 11/21/14   Elwin Mocha, MD  ibuprofen (ADVIL,MOTRIN) 800 MG tablet Take 1 tablet (800 mg total) by mouth 3 (three) times daily. 11/21/14   Elwin Mocha, MD  ketoconazole (NIZORAL) 2 % cream Apply 1 application topically daily. Apply to both feet and between toes once daily for 6 weeks 03/18/18   Freddie Breech, DPM  metFORMIN (GLUCOPHAGE) 500 MG tablet Take 500 mg by mouth 2 (two) times daily. 02/18/20   [provider]  METFORMIN HCL PO Take by mouth.    [provider]  Omega-3 Fatty Acids (FISH OIL) 1000 MG CAPS 4    [provider]  Testosterone 25  MG/2.5GM (1%) GEL 1 pkt(s)    [provider]      Allergies    Patient has no known allergies.    Review of Systems   Review of Systems  Neurological:  Positive for weakness.  All other systems reviewed and are negative.  Physical Exam Updated Vital Signs BP (!) 142/86    Pulse 82    Temp 97.9 F (36.6 C) (Oral)    Resp 20    Ht 5\' 7"  (1.702 m)    Wt 89.4 kg    SpO2 99%    BMI 30.85 kg/m  Physical Exam Vitals and nursing note reviewed.  Constitutional:      Appearance: Normal appearance.  HENT:     Head: Normocephalic and atraumatic.     Right Ear: External ear normal.     Left Ear: External ear normal.     Nose: Nose normal.     Mouth/Throat:     Mouth: Mucous membranes are moist.     Pharynx: Oropharynx is clear.  Eyes:     Extraocular Movements: Extraocular movements intact.     Conjunctiva/sclera: Conjunctivae normal.     Pupils: Pupils are equal, round, and reactive to light.  Cardiovascular:     Rate and Rhythm: Normal rate  and regular rhythm.     Pulses: Normal pulses.     Heart sounds: Normal heart sounds.  Pulmonary:     Effort: Pulmonary effort is normal.     Breath sounds: Normal breath sounds.  Abdominal:     General: Abdomen is flat. Bowel sounds are normal.     Palpations: Abdomen is soft.  Musculoskeletal:        General: Normal range of motion.     Cervical back: Normal range of motion and neck supple.  Skin:    General: Skin is warm.     Capillary Refill: Capillary refill takes less than 2 seconds.  Neurological:     Mental Status: He is alert.     Comments: Mild weakness on left compared to right.  Otherwise, pt is neurologically intact.  Psychiatric:        Mood and Affect: Mood normal.        Behavior: Behavior normal.    ED Results / Procedures / Treatments   Labs (all labs ordered are listed, but only abnormal results are displayed) Labs Reviewed  CBC - Abnormal; Notable for the following components:      Result Value    Hemoglobin 12.5 (*)    MCH 25.6 (*)    All other components within normal limits  COMPREHENSIVE METABOLIC PANEL - Abnormal; Notable for the following components:   Glucose, Bld 142 (*)    All other components within normal limits  RESP PANEL BY RT-PCR (FLU A&B, COVID) ARPGX2  PROTIME-INR  DIFFERENTIAL  URINALYSIS, ROUTINE W REFLEX MICROSCOPIC  ETHANOL  RAPID URINE DRUG SCREEN, HOSP PERFORMED    EKG EKG Interpretation  Date/Time:  Monday May 28 2021 11:44:25 EST Ventricular Rate:  86 PR Interval:  138 QRS Duration: 92 QT Interval:  355 QTC Calculation: 425 R Axis:   72 Text Interpretation: Sinus rhythm Consider left atrial enlargement Abnormal R-wave progression, early transition No old tracing to compare Confirmed by Jacalyn LefevreHaviland, Rami Budhu [phone]) on 05/28/2021 12:21:35 PM  Radiology CT HEAD WO CONTRAST  Result Date: 05/28/2021 CLINICAL DATA:  Left-sided weakness. EXAM: CT HEAD WITHOUT CONTRAST TECHNIQUE: Contiguous axial images were obtained from the base of the skull through the vertex without intravenous contrast. RADIATION DOSE REDUCTION: This exam was performed according to the departmental dose-optimization program which includes automated exposure control, adjustment of the mA and/or kV according to patient size and/or use of iterative reconstruction technique. COMPARISON:  None. FINDINGS: Brain: No evidence of acute infarction, hemorrhage, hydrocephalus, extra-axial collection or mass lesion/mass effect. Vascular: No hyperdense vessel or unexpected calcification. Skull: Normal. Negative for fracture or focal lesion. Sinuses/Orbits: No acute finding. Other: None. IMPRESSION: No acute intracranial abnormality seen. Electronically Signed   By: Lupita RaiderJames  Green Jr M.D.   On: 05/28/2021 12:03    Procedures Procedures    Medications Ordered in ED Medications  sodium chloride 0.9 % bolus 500 mL (0 mLs Intravenous Stopped 05/28/21 1230)    Followed by  0.9 %  sodium chloride infusion  (100 mL/hr Intravenous New Bag/Given 05/28/21 1229)    ED Course/ Medical Decision Making/ A&P                           Medical Decision Making Amount and/or Complexity of Data Reviewed Labs: ordered. Radiology: ordered.  Risk Prescription drug management.   Due to left arm and leg weakness, stroke workup was started.  CT and labs reviewed by me. Pt's CT head neg.  Labs unremarkable other than mild elevation in blood sugar.  He does have a hx of dm. As the CT is negative, pt will need a MRI as I have a strong suspicion of stroke.  He is in agreement to go to [facility] for a MRI of his brain.  He wants to go POV.  I spoke with Dr. Criss Alvine who accepted him for transfer.    Final Clinical Impression(s) / ED Diagnoses Final diagnoses:  Weakness    Rx / DC Orders ED Discharge Orders     None         Jacalyn Lefevre, MD 05/28/21 1238    Jacalyn Lefevre, MD 05/28/21 1251

## 2021-05-28 NOTE — ED Notes (Signed)
Pt called multiple times for vital signs and room with no response.

## 2021-05-28 NOTE — ED Notes (Signed)
Called MRI and notified that the patient would be coming to [facility] ED for MRI

## 2021-05-31 DIAGNOSIS — I6350 Cerebral infarction due to unspecified occlusion or stenosis of unspecified cerebral artery: Secondary | ICD-10-CM | POA: Insufficient documentation

## 2021-05-31 NOTE — Progress Notes (Addendum)
Patient referred by [name], MD for stroke  Subjective:   Joshua Harrington, male    DOB: 1960-06-11, 61 y.o.   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   Patient retired from [employer] year ago, takes care of his wife who had stroke in 2011.  He is active, exercises regularly.  2 weeks ago, he started feeling left-sided weakness and coordination difficulty.  This started about 2 days after his last workout.  Patient subsequently saw his PCP Dr. Iona Beard Osei-Bonsu who advised him to go to the emergency room given his left-sided weakness.  Patient went to [place].  CT head there did not show any acute stroke.  However, given high clinical suspicion for stroke, he was sent to [facility] emergency room.  MRI there showed acute right pontine stroke with concern for possible vertebral artery dissection.  It appears that he did not complete the work-up there, and left from waiting room.  He again saw his PCP Dr. Vista Lawman for follow-up who referred him to me.  Patient reports that he feels that he is back to his baseline normal and denies any significant weakness.  He also denies any vision changes.  His balance is also improved.   He is scheduled to see Neurologist Dr Cherylann Banas at [facility].    Past Medical History:  Diagnosis Date   Diabetes mellitus without complication (HCC)    Hernia    High cholesterol      Past Surgical History:  Procedure Laterality Date   ABDOMINAL SURGERY     HERNIA REPAIR     VASECTOMY       Social History   Tobacco Use  Smoking Status Never  Smokeless Tobacco Never    Social History   Substance and Sexual Activity  Alcohol Use No     Family History  Problem Relation Age of Onset   Cancer Mother        Stomach    Cirrhosis Father      Current Outpatient Medications on File Prior to Visit  Medication Sig Dispense Refill   amLODipine-olmesartan (AZOR) 5-20 MG tablet Take 1 tablet by mouth daily.     aspirin 325 MG tablet Take 1 tablet by mouth daily.     atorvastatin (LIPITOR) 80 MG tablet Take 1 tablet by mouth daily.     Cholecalciferol (VITAMIN D3) 125 MCG (5000 UT) CAPS Take 1 capsule by mouth daily.     fish oil-omega-3 fatty acids 1000 MG capsule Take 1 g by mouth daily. Take 4 daily     ketoconazole (NIZORAL) 2 % cream Apply 1 application topically daily. Apply to both feet and between toes once daily for 6 weeks 30 g 1   metFORMIN (GLUCOPHAGE) 500 MG tablet Take 500 mg by mouth 2 (two) times daily.     Testosterone 25 MG/2.5GM (1%) GEL 1 pkt(s)     Turmeric 450 MG CAPS Take by mouth.     gabapentin (NEURONTIN) 300 MG capsule Take 300 mg by mouth 2 (two) times daily.     HYDROcodone-acetaminophen (NORCO/VICODIN) 5-325 MG per tablet Take 1 tablet by mouth every 6 (six) hours as needed for moderate pain. 20 tablet 0   ibuprofen (ADVIL,MOTRIN) 800 MG tablet Take 1  tablet (800 mg total) by mouth 3 (three) times daily. 21 tablet 0   METFORMIN HCL PO Take by mouth.     Omega-3 Fatty Acids (FISH OIL) 1000 MG CAPS 4     No current facility-administered medications on file prior to visit.    Cardiovascular and other pertinent studies:  EKG 06/01/2021: Sinus rhythm 95 bpm Normal EKG  MRI brain 05/28/2021: 1. Acute right pontine infarct. 2. Mild-to-moderate chronic small vessel ischemic disease. 3. Abnormal appearance of the distal right vertebral artery which may reflect slow flow or occlusion. Consider head and neck CTA or MRA for further evaluation.     Recent labs: 05/28/2021: Glucose 142, BUN/Cr 16/0.79. EGFR >60. Na/K 137/3.8.  H/H 12/39. MCV 81. Platelets 289  02/10/2020: HbA1C 7.4% Chol 143, TG 111, HDL 46, LDL 77 TSH 1.3 normal     Review of Systems  Cardiovascular:   Negative for chest pain, dyspnea on exertion, leg swelling, palpitations and syncope.  Neurological:        Left sided weakness and balance issue, now improved         Vitals:   06/01/21 1133  BP: 135/87  Pulse: 95  SpO2: 97%     Body mass index is 31.17 kg/m. Filed Weights   06/01/21 1133  Weight: 199 lb (90.3 kg)     Objective:   Physical Exam Vitals and nursing note reviewed.  Constitutional:      General: He is not in acute distress. Neck:     Vascular: No JVD.  Cardiovascular:     Rate and Rhythm: Normal rate and regular rhythm.     Heart sounds: Normal heart sounds. No murmur heard. Pulmonary:     Effort: Pulmonary effort is normal.     Breath sounds: Normal breath sounds. No wheezing or rales.  Musculoskeletal:     Right lower leg: No edema.     Left lower leg: No edema.  Neurological:     Cranial Nerves: Cranial nerves 2-12 are intact.     Motor: Motor function is intact.     Coordination: Finger-Nose-Finger Test abnormal (Mild ataxia LUE).        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   Stroke: Pontine stroke, subacute at this point Mild LUE ataxia.  Recommend stat CTA head/neck, echocardiogram w/bubble study Continue Aspirin 325 mg, lipitor 80 mg. Defer duration of Aspirin 325 mg to Neurology  Added plavix 75 mg  Keep f/u w/Neurology  Hypertension, hyperlipidemia: Fairly well controlled  Type 2 diabetes melitus: Fairly well controlled. Continue f/u w/PCP  Further recommendations after above testing  Thank you for referring the patient to [place]. Please feel free to contact with any questions.   Nigel Mormon, MD Pager: [phone] Office: [phone]   Addendum:  CTA head/neck 06/01/2021: 1. Occlusion of the distal right vertebral artery. 2. 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 3. Widely  patent cervical carotid arteries.     Reviewed. Continue Aspirin 325/plavix 75 for now. Keep f/u w/Neurology   Nigel Mormon, MD Pager: [phone] Office: [phone]

## 2021-06-01 ENCOUNTER — Encounter: Payer: Self-pay | Admitting: Cardiology

## 2021-06-01 ENCOUNTER — Ambulatory Visit: Payer: Managed Care, Other (non HMO) | Admitting: Cardiology

## 2021-06-01 ENCOUNTER — Other Ambulatory Visit: Payer: Self-pay

## 2021-06-01 ENCOUNTER — Ambulatory Visit
Admission: RE | Admit: 2021-06-01 | Discharge: 2021-06-01 | Disposition: A | Discharge status: Home / Self Care | Payer: Managed Care, Other (non HMO) | Source: Ambulatory Visit | Attending: Cardiology | Admitting: Cardiology

## 2021-06-01 ENCOUNTER — Other Ambulatory Visit: Payer: Self-pay | Admitting: Cardiology

## 2021-06-01 VITALS — BP 135/87 | HR 95 | Ht 67.0 in | Wt 199.0 lb

## 2021-06-01 DIAGNOSIS — I10 Essential (primary) hypertension: Secondary | ICD-10-CM

## 2021-06-01 DIAGNOSIS — E782 Mixed hyperlipidemia: Secondary | ICD-10-CM

## 2021-06-01 DIAGNOSIS — E118 Type 2 diabetes mellitus with unspecified complications: Secondary | ICD-10-CM | POA: Insufficient documentation

## 2021-06-01 DIAGNOSIS — I6350 Cerebral infarction due to unspecified occlusion or stenosis of unspecified cerebral artery: Secondary | ICD-10-CM

## 2021-06-01 IMAGING — CT CT ANGIO HEAD-NECK (W OR W/O PERF)
2 of 11 series · 4 of 33 positions shown · non-contrast
Comparison: Head MRI [DATE]

CLINICAL DATA: Stroke, follow-up. Right pontine infarct. Left arm
and leg weakness.

EXAM:
CT ANGIOGRAPHY HEAD AND NECK
TECHNIQUE: Multidetector CT imaging of the head and neck was performed using
the standard protocol during bolus administration of intravenous
contrast. Multiplanar CT image reconstructions and MIPs were
obtained to evaluate the vascular anatomy. Carotid stenosis
measurements (when applicable) are obtained utilizing NASCET
criteria, using the distal internal carotid diameter as the
denominator.

[Series 15: brain 3.00 hr40 s3 sag without ibhc · sagittal · non-contrast · 0.35mm/px · 1 of 64 slices shown]
[im 32/64  soft-tissue]
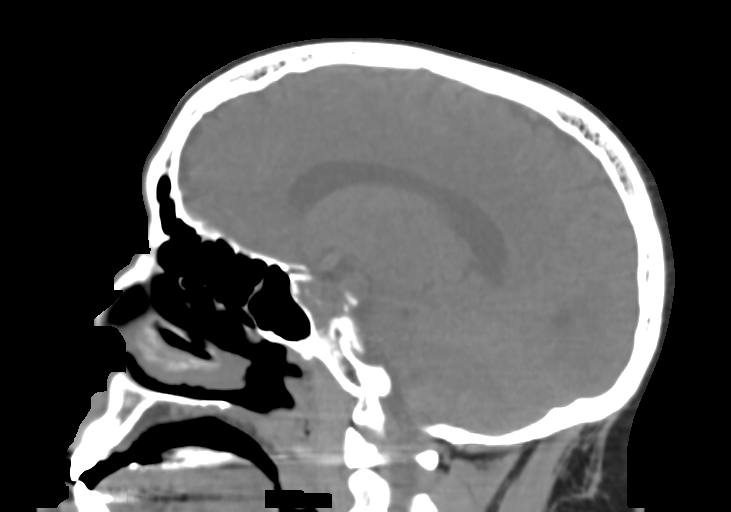

[Series 18: cta head & neck 1.00 hv48 s3 ax thin mips · axial · 0.53mm/px · z∈[-850,-474]mm · 3 of 377 slices shown]
[im 1/377  soft-tissue]
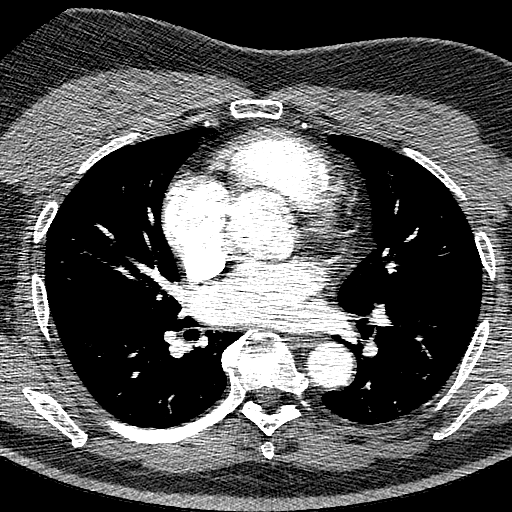
[im 189/377  bone]
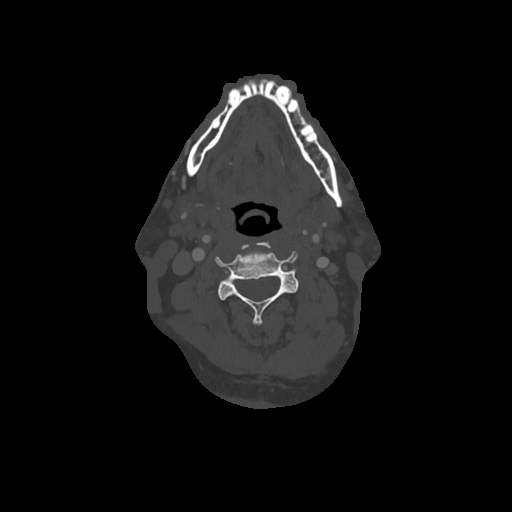
[im 377/377  soft-tissue]
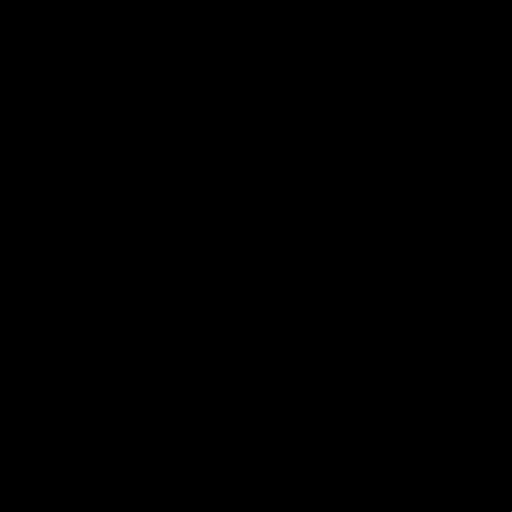

[4 of 33 positions shown; findings below may reference images not displayed]

RADIATION DOSE REDUCTION: This exam was performed according to the
departmental dose-optimization program which includes automated
exposure control, adjustment of the mA and/or kV according to
patient size and/or use of iterative reconstruction technique.

CONTRAST:  75mL [MK] IOPAMIDOL ([MK]) INJECTION 76%
FINDINGS: CT HEAD FINDINGS

Brain: The acute right pontine infarct on MRI is not well shown by
CT. Hypodensities in the cerebral white matter bilaterally are
nonspecific but compatible with mild-to-moderate chronic small
vessel ischemic disease. No acute intracranial hemorrhage, mass,
midline shift, or extra-axial fluid collection is identified. The
ventricles and sulci are within normal limits for age.

Vascular: Calcified atherosclerosis at the skull base.

Skull: No fracture or suspicious osseous lesion.

Sinuses: Paranasal sinuses and mastoid air cells are clear.

Orbits: Unremarkable.

Review of the MIP images confirms the above findings

CTA NECK FINDINGS

Aortic arch: Normal variant aortic arch branching pattern with
common origin of the brachiocephalic and left common carotid
arteries. Widely patent arch vessel origins.

Right carotid system: Patent without evidence of stenosis,
dissection, or significant atherosclerosis.

Left carotid system: Patent without evidence of stenosis,
dissection, or significant atherosclerosis.

Vertebral arteries: The left vertebral artery is patent and dominant
without evidence of a significant stenosis or dissection. The right
vertebral artery is small and is patent through the V1 and V2
segments but demonstrates progressively diminishing opacification of
the V3 segment with occlusion near the V3-V4 junction.

Skeleton: No acute osseous abnormality or suspicious osseous lesion.
Mild cervical spondylosis.

Other neck: No evidence of cervical lymphadenopathy or mass.

Upper chest: Clear lung apices.

Review of the MIP images confirms the above findings

CTA HEAD FINDINGS

Anterior circulation: The internal carotid arteries are patent from
skull base to carotid termini with mild-to-moderate atherosclerotic
plaque bilaterally not resulting in significant stenosis. ACAs and
MCAs are patent without evidence of a proximal branch occlusion or
significant proximal stenosis. No aneurysm is identified.

Posterior circulation: The intracranial left vertebral artery is
patent and supplies the basilar. The intracranial right vertebral
artery is occluded. The basilar artery is patent proximally but is
heavily diseased, and there segmental occlusion of the mid basilar
artery beginning just distal to the right AICA origin. There is
reconstitution of the distal basilar artery proximal to the SCA
origins. There are large posterior communicating arteries and
hypoplastic P1 segments bilaterally. There is a severe stenosis at
the origin of a common left PCA/SCA trunk. No significant proximal
PCA stenosis is evident elsewhere. No aneurysm is identified
elsewhere.

Venous sinuses: As permitted by contrast timing, patent.

Anatomic variants: Fetal origin of the PCAs.

Review of the MIP images confirms the above findings
IMPRESSION: 1. Occlusion of the distal right vertebral artery.
2. Intracranial atherosclerosis with segmental occlusion of the mid
basilar artery.
3. Widely patent cervical carotid arteries.

## 2021-06-01 MED ORDER — CLOPIDOGREL BISULFATE 75 MG PO TABS: 75.0000 mg | ORAL_TABLET | Freq: Every day | ORAL | 3 refills | Status: DC

## 2021-06-01 MED ORDER — IOPAMIDOL (ISOVUE-370) INJECTION 76%
75.0000 mL | Freq: Once | INTRAVENOUS | Status: AC | PRN
  Administered 2021-06-01: 75 mL via INTRAVENOUS

## 2021-06-14 ENCOUNTER — Ambulatory Visit: Payer: Managed Care, Other (non HMO)

## 2021-06-14 ENCOUNTER — Other Ambulatory Visit: Payer: Self-pay

## 2021-06-14 DIAGNOSIS — I6350 Cerebral infarction due to unspecified occlusion or stenosis of unspecified cerebral artery: Secondary | ICD-10-CM

## 2021-06-21 ENCOUNTER — Other Ambulatory Visit: Payer: Self-pay

## 2021-06-21 DIAGNOSIS — I6350 Cerebral infarction due to unspecified occlusion or stenosis of unspecified cerebral artery: Secondary | ICD-10-CM

## 2021-06-21 MED ORDER — CLOPIDOGREL BISULFATE 75 MG PO TABS: 75.0000 mg | ORAL_TABLET | Freq: Every day | ORAL | 3 refills | Status: DC

## 2021-06-22 ENCOUNTER — Ambulatory Visit: Payer: Managed Care, Other (non HMO) | Admitting: Podiatry

## 2021-07-01 ENCOUNTER — Emergency Department (HOSPITAL_BASED_OUTPATIENT_CLINIC_OR_DEPARTMENT_OTHER): Payer: Commercial Managed Care - HMO

## 2021-07-01 ENCOUNTER — Observation Stay (HOSPITAL_BASED_OUTPATIENT_CLINIC_OR_DEPARTMENT_OTHER)
Admission: EM | Admit: 2021-07-01 | Discharge: 2021-07-03 | Disposition: A | Discharge status: Home / Self Care | Payer: Commercial Managed Care - HMO | Attending: Internal Medicine | Admitting: Internal Medicine

## 2021-07-01 ENCOUNTER — Encounter (HOSPITAL_BASED_OUTPATIENT_CLINIC_OR_DEPARTMENT_OTHER): Payer: Self-pay | Admitting: Emergency Medicine

## 2021-07-01 ENCOUNTER — Other Ambulatory Visit: Payer: Self-pay

## 2021-07-01 DIAGNOSIS — Z20822 Contact with and (suspected) exposure to covid-19: Secondary | ICD-10-CM | POA: Diagnosis not present

## 2021-07-01 DIAGNOSIS — Z79899 Other long term (current) drug therapy: Secondary | ICD-10-CM | POA: Diagnosis not present

## 2021-07-01 DIAGNOSIS — E782 Mixed hyperlipidemia: Secondary | ICD-10-CM | POA: Diagnosis present

## 2021-07-01 DIAGNOSIS — I779 Disorder of arteries and arterioles, unspecified: Secondary | ICD-10-CM

## 2021-07-01 DIAGNOSIS — M6281 Muscle weakness (generalized): Secondary | ICD-10-CM | POA: Diagnosis not present

## 2021-07-01 DIAGNOSIS — R2689 Other abnormalities of gait and mobility: Secondary | ICD-10-CM | POA: Insufficient documentation

## 2021-07-01 DIAGNOSIS — Z7902 Long term (current) use of antithrombotics/antiplatelets: Secondary | ICD-10-CM | POA: Insufficient documentation

## 2021-07-01 DIAGNOSIS — I651 Occlusion and stenosis of basilar artery: Secondary | ICD-10-CM | POA: Diagnosis not present

## 2021-07-01 DIAGNOSIS — E669 Obesity, unspecified: Secondary | ICD-10-CM | POA: Diagnosis not present

## 2021-07-01 DIAGNOSIS — Z6830 Body mass index (BMI) 30.0-30.9, adult: Secondary | ICD-10-CM | POA: Diagnosis not present

## 2021-07-01 DIAGNOSIS — I10 Essential (primary) hypertension: Secondary | ICD-10-CM | POA: Diagnosis present

## 2021-07-01 DIAGNOSIS — I69351 Hemiplegia and hemiparesis following cerebral infarction affecting right dominant side: Secondary | ICD-10-CM | POA: Diagnosis not present

## 2021-07-01 DIAGNOSIS — E118 Type 2 diabetes mellitus with unspecified complications: Secondary | ICD-10-CM | POA: Diagnosis present

## 2021-07-01 DIAGNOSIS — E291 Testicular hypofunction: Secondary | ICD-10-CM | POA: Diagnosis present

## 2021-07-01 DIAGNOSIS — I6501 Occlusion and stenosis of right vertebral artery: Secondary | ICD-10-CM | POA: Diagnosis not present

## 2021-07-01 DIAGNOSIS — Z8673 Personal history of transient ischemic attack (TIA), and cerebral infarction without residual deficits: Secondary | ICD-10-CM

## 2021-07-01 DIAGNOSIS — R200 Anesthesia of skin: Secondary | ICD-10-CM | POA: Diagnosis present

## 2021-07-01 DIAGNOSIS — E119 Type 2 diabetes mellitus without complications: Secondary | ICD-10-CM | POA: Diagnosis not present

## 2021-07-01 DIAGNOSIS — Z7984 Long term (current) use of oral hypoglycemic drugs: Secondary | ICD-10-CM | POA: Insufficient documentation

## 2021-07-01 DIAGNOSIS — I639 Cerebral infarction, unspecified: Secondary | ICD-10-CM | POA: Diagnosis present

## 2021-07-01 DIAGNOSIS — R202 Paresthesia of skin: Secondary | ICD-10-CM | POA: Diagnosis present

## 2021-07-01 HISTORY — DX: Essential (primary) hypertension: I10

## 2021-07-01 LAB — URINALYSIS, ROUTINE W REFLEX MICROSCOPIC
Bilirubin Urine: NEGATIVE
Glucose, UA: NEGATIVE mg/dL
Hgb urine dipstick: NEGATIVE
Ketones, ur: NEGATIVE mg/dL
Leukocytes,Ua: NEGATIVE
Nitrite: NEGATIVE
Protein, ur: NEGATIVE mg/dL
Specific Gravity, Urine: 1.025 (ref 1.005–1.030)
pH: 6 (ref 5.0–8.0)

## 2021-07-01 LAB — RAPID URINE DRUG SCREEN, HOSP PERFORMED
Amphetamines: NOT DETECTED
Barbiturates: NOT DETECTED
Benzodiazepines: NOT DETECTED
Cocaine: NOT DETECTED
Opiates: NOT DETECTED
Tetrahydrocannabinol: NOT DETECTED

## 2021-07-01 LAB — COMPREHENSIVE METABOLIC PANEL
ALT: 18 U/L (ref 0–44)
AST: 19 U/L (ref 15–41)
Albumin: 3.8 g/dL (ref 3.5–5.0)
Alkaline Phosphatase: 82 U/L (ref 38–126)
Anion gap: 8 (ref 5–15)
BUN: 11 mg/dL (ref 8–23)
CO2: 26 mmol/L (ref 22–32)
Calcium: 9 mg/dL (ref 8.9–10.3)
Chloride: 101 mmol/L (ref 98–111)
Creatinine, Ser: 0.71 mg/dL (ref 0.61–1.24)
GFR, Estimated: >60 mL/min (ref >60)
Glucose, Bld: 141 mg/dL — ABNORMAL HIGH (ref 70–99)
Potassium: 3.9 mmol/L (ref 3.5–5.1)
Sodium: 135 mmol/L (ref 135–145)
Total Bilirubin: 0.4 mg/dL (ref 0.3–1.2)
Total Protein: 7.8 g/dL (ref 6.5–8.1)

## 2021-07-01 LAB — DIFFERENTIAL
Abs Immature Granulocytes: 0.03 10*3/uL (ref 0.00–0.07)
Basophils Absolute: 0 10*3/uL (ref 0.0–0.1)
Basophils Relative: 0 %
Eosinophils Absolute: 0.2 10*3/uL (ref 0.0–0.5)
Eosinophils Relative: 2 %
Immature Granulocytes: 0 %
Lymphocytes Relative: 37 %
Lymphs Abs: 3.6 10*3/uL (ref 0.7–4.0)
Monocytes Absolute: 0.7 10*3/uL (ref 0.1–1.0)
Monocytes Relative: 8 %
Neutro Abs: 5.1 10*3/uL (ref 1.7–7.7)
Neutrophils Relative %: 53 %

## 2021-07-01 LAB — CBC
HCT: 39.3 % (ref 39.0–52.0)
Hemoglobin: 12.1 g/dL — ABNORMAL LOW (ref 13.0–17.0)
MCH: 25.2 pg — ABNORMAL LOW (ref 26.0–34.0)
MCHC: 30.8 g/dL (ref 30.0–36.0)
MCV: 81.7 fL (ref 80.0–100.0)
Platelets: 299 10*3/uL (ref 150–400)
RBC: 4.81 MIL/uL (ref 4.22–5.81)
RDW: 15.1 % (ref 11.5–15.5)
WBC: 9.7 10*3/uL (ref 4.0–10.5)
nRBC: 0 % (ref 0.0–0.2)

## 2021-07-01 LAB — CBG MONITORING, ED: Glucose-Capillary: 152 mg/dL — ABNORMAL HIGH (ref 70–99)

## 2021-07-01 LAB — PROTIME-INR
INR: 1 (ref 0.8–1.2)
Prothrombin Time: 13.5 seconds (ref 11.4–15.2)

## 2021-07-01 LAB — RESP PANEL BY RT-PCR (FLU A&B, COVID) ARPGX2
Influenza A by PCR: NEGATIVE
Influenza B by PCR: NEGATIVE
SARS Coronavirus 2 by RT PCR: NEGATIVE

## 2021-07-01 LAB — ETHANOL: Alcohol, Ethyl (B): <10 mg/dL (ref <10)

## 2021-07-01 LAB — APTT: aPTT: 28 seconds (ref 24–36)

## 2021-07-01 IMAGING — CT CT ANGIO HEAD-NECK (W OR W/O PERF)
1 of 8 series · 6 of 33 positions shown · non-contrast
Comparison: Head CT [DATE]

CTA head neck [DATE]

CLINICAL DATA: Tingling sensation upon waking

EXAM:
CT ANGIOGRAPHY HEAD AND NECK
TECHNIQUE: Multidetector CT imaging of the head and neck was performed using
the standard protocol during bolus administration of intravenous
contrast. Multiplanar CT image reconstructions and MIPs were
obtained to evaluate the vascular anatomy. Carotid stenosis
measurements (when applicable) are obtained utilizing NASCET
criteria, using the distal internal carotid diameter as the
denominator.

[Series 7: axial thin · axial · 0.49mm/px · z∈[-257,-37]mm · 6 of 310 slices shown]
[im 45/310  soft-tissue]
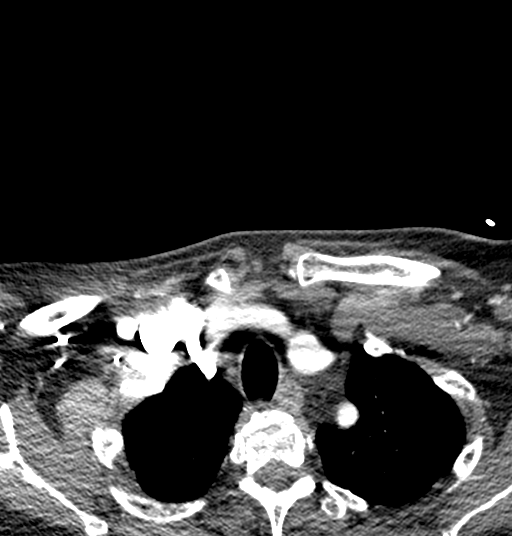
[im 89/310  bone]
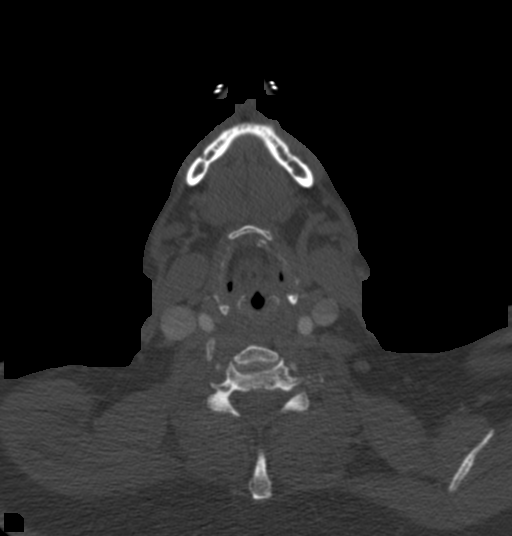
[im 133/310  soft-tissue]
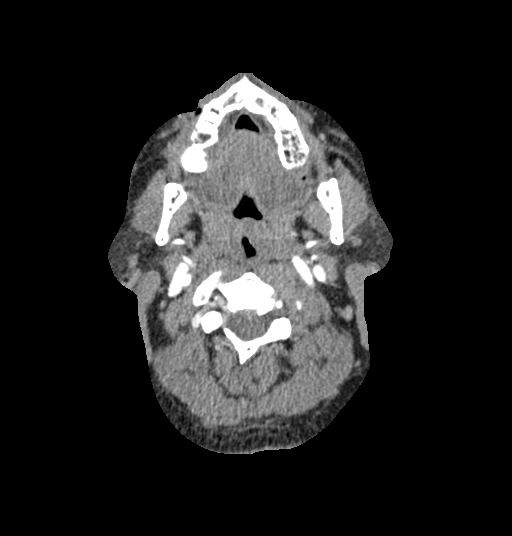
[im 177/310  bone]
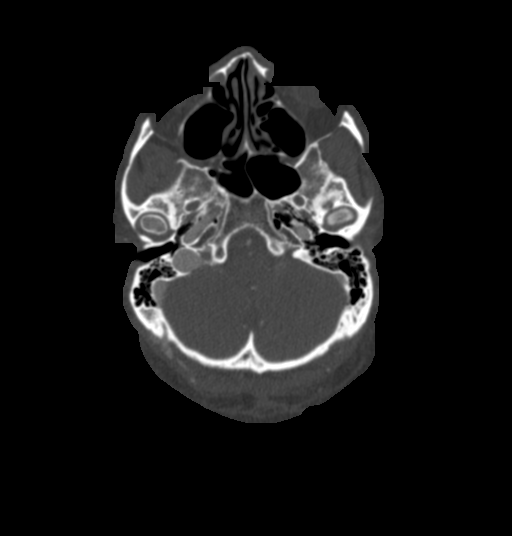
[im 221/310  soft-tissue]
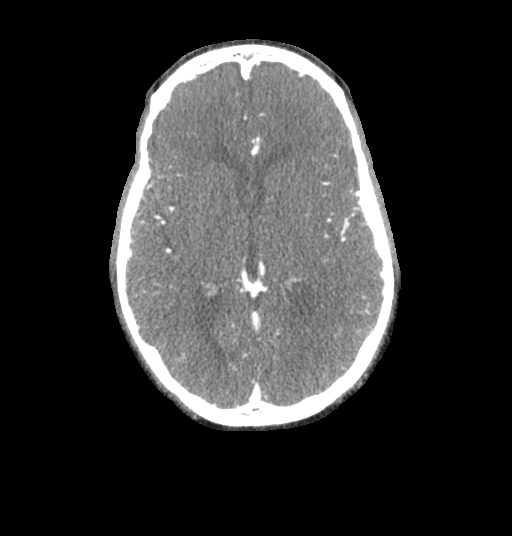
[im 265/310  bone]
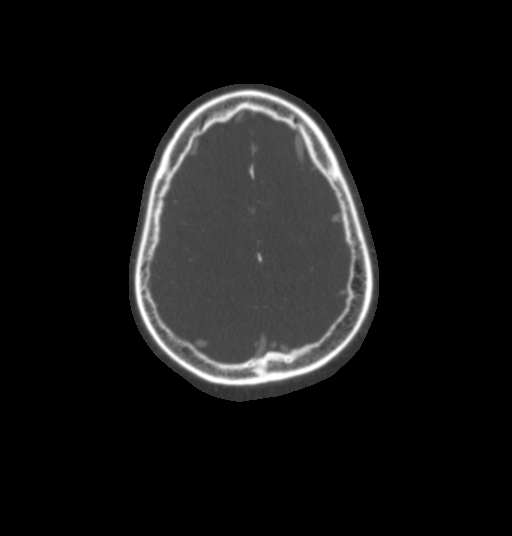

[6 of 33 positions shown; findings below may reference images not displayed]

RADIATION DOSE REDUCTION: This exam was performed according to the
departmental dose-optimization program which includes automated
exposure control, adjustment of the mA and/or kV according to
patient size and/or use of iterative reconstruction technique.

CONTRAST:  100mL OMNIPAQUE IOHEXOL 350 MG/ML SOLN
FINDINGS: CTA NECK FINDINGS

SKELETON: There is no bony spinal canal stenosis. No lytic or
blastic lesion.

OTHER NECK: Normal pharynx, larynx and major salivary glands. No
cervical lymphadenopathy. Unremarkable thyroid gland.

UPPER CHEST: No pneumothorax or pleural effusion. No nodules or
masses.

AORTIC ARCH:

There is no calcific atherosclerosis of the aortic arch. There is no
aneurysm, dissection or hemodynamically significant stenosis of the
visualized portion of the aorta. Normal variant aortic arch
branching pattern with the brachiocephalic and left common carotid
arteries sharing a common origin. The visualized proximal subclavian
arteries are widely patent.

RIGHT CAROTID SYSTEM: Normal without aneurysm, dissection or
stenosis.

LEFT CAROTID SYSTEM: Normal without aneurysm, dissection or
stenosis.

VERTEBRAL ARTERIES: Left dominant configuration. Both origins are
clearly patent. There is no dissection, occlusion or flow-limiting
stenosis to the skull base (V1-V3 segments).

CTA HEAD FINDINGS

POSTERIOR CIRCULATION:

--Vertebral arteries: Unchanged occlusion of the distal right V4
segment. Normal left V4.

--Inferior cerebellar arteries: Normal.

--Basilar artery: Unchanged occlusion of the midportion of the
basilar artery

--Superior cerebellar arteries: Normal.

--Posterior cerebral arteries (PCA): Normal.

ANTERIOR CIRCULATION:

--Intracranial internal carotid arteries: Normal.

--Anterior cerebral arteries (ACA): Normal. Both A1 segments are
present. Patent anterior communicating artery (a-comm).

--Middle cerebral arteries (MCA): Normal.

VENOUS SINUSES: As permitted by contrast timing, patent.

ANATOMIC VARIANTS: Fetal origins of both posterior cerebral
arteries.

Review of the MIP images confirms the above findings.
IMPRESSION: 1. No new arterial occlusion.
2. Unchanged occlusion of the distal right V4 segment and the
midportion of the basilar artery.
3. Fetal origins of both posterior cerebral arteries.

## 2021-07-01 MED ORDER — IOHEXOL 350 MG/ML SOLN
100.0000 mL | Freq: Once | INTRAVENOUS | Status: AC | PRN
Start: 2021-07-01 — End: 2021-07-01
  Administered 2021-07-01: 100 mL via INTRAVENOUS

## 2021-07-01 NOTE — ED Triage Notes (Signed)
Pt arrives pov, ambulatory to triage, c/o tingling upon waking at 12pm today. Last normal 11pm last night. Pt denies HA. AO x4, bilaterally equal. PA Wylder at bedside to eval. VAN negative

## 2021-07-01 NOTE — ED Provider Notes (Signed)
[facility] EMERGENCY DEPARTMENT Provider Note   CSN: [id number] Arrival date & time: 07/01/21  1846     History  Chief Complaint  Patient presents with   Tingling    Joshua Harrington is a 61 y.o. male.  Patient is a 61 year old male who presents with numbness to his right face arm and leg.  He describes it as a tingling sensation.  He said he woke up around lunchtime at 12 PM and noticed that it was there at that time.  He said it went away and then at around 5 PM this evening it came back again.  He denies any weakness of his arm or his leg.  He denies any speech deficits.  No vision changes.  No headache.  No difficulty with his balance.  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  This had presented with left-sided weakness.  He says he is back to baseline currently.  On chart review, he was seen at [facility] ED on January 23.  He had a normal head CT.  However given his symptoms, he was transferred to the ED at [facility] to have an MRI.  The MRI was performed which showed an acute pontine infarct but patient left from the waiting room without being seen by the ED provider there.  He did follow-up with his physician who had a CTA of the head and neck.  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  Patient was continued on aspirin and started Plavix.  He also had an echocardiogram which was unremarkable on chart review.      Home Medications Prior to Admission medications   Medication Sig Start Date End Date Taking? Authorizing Provider  amLODipine-olmesartan (AZOR) 5-20 MG tablet Take 1 tablet by mouth daily.  10/20/21  [provider]  aspirin 325 MG tablet Take 1 tablet by mouth daily. 05/30/21   [provider]  atorvastatin (LIPITOR) 80 MG tablet Take 1 tablet by mouth daily. 05/30/21   [provider]  Cholecalciferol (VITAMIN D3) 125 MCG (5000 UT) CAPS Take 1 capsule by  mouth daily.    [provider]  clopidogrel (PLAVIX) 75 MG tablet Take 1 tablet (75 mg total) by mouth daily. 06/21/21   Patwardhan, Reynold Bowen, MD  fish oil-omega-3 fatty acids 1000 MG capsule Take 1 g by mouth daily. Take 4 daily    [provider]  ketoconazole (NIZORAL) 2 % cream Apply 1 application topically daily. Apply to both feet and between toes once daily for 6 weeks 03/18/18   Marzetta Board, DPM  metFORMIN (GLUCOPHAGE) 500 MG tablet Take 500 mg by mouth 2 (two) times daily. 02/18/20   [provider]  METFORMIN HCL PO Take by mouth.    [provider]  Testosterone 25 MG/2.5GM (1%) GEL 1 pkt(s)    [provider]  Turmeric 450 MG CAPS Take by mouth.    [provider]      Allergies    Patient has no known allergies.    Review of Systems   Review of Systems  Constitutional:  Negative for chills, diaphoresis, fatigue and fever.  HENT:  Negative for congestion, rhinorrhea and sneezing.   Eyes: Negative.   Respiratory:  Negative for cough, chest tightness and shortness of breath.   Cardiovascular:  Negative for chest pain and leg swelling.  Gastrointestinal:  Negative for abdominal pain, blood in stool,  diarrhea, nausea and vomiting.  Genitourinary:  Negative for difficulty urinating, flank pain, frequency and hematuria.  Musculoskeletal:  Negative for arthralgias and back pain.  Skin:  Negative for rash.  Neurological:  Positive for numbness. Negative for dizziness, speech difficulty, weakness and headaches.   Physical Exam Updated Vital Signs BP (!) 157/105 (BP Location: Left Arm)    Pulse 88    Temp 98.2 F (36.8 C) (Oral)    Resp 15    Ht 5\' 7"  (1.702 m)    Wt 89.4 kg    SpO2 100%    BMI 30.85 kg/m  Physical Exam Constitutional:      Appearance: He is well-developed.  HENT:     Head: Normocephalic and atraumatic.  Eyes:     Pupils: Pupils are equal, round, and reactive to light.  Cardiovascular:     Rate and  Rhythm: Normal rate and regular rhythm.     Heart sounds: Normal heart sounds.  Pulmonary:     Effort: Pulmonary effort is normal. No respiratory distress.     Breath sounds: Normal breath sounds. No wheezing or rales.  Chest:     Chest wall: No tenderness.  Abdominal:     General: Bowel sounds are normal.     Palpations: Abdomen is soft.     Tenderness: There is no abdominal tenderness. There is no guarding or rebound.  Musculoskeletal:        General: Normal range of motion.     Cervical back: Normal range of motion and neck supple.  Lymphadenopathy:     Cervical: No cervical adenopathy.  Skin:    General: Skin is warm and dry.     Findings: No rash.  Neurological:     Mental Status: He is alert and oriented to person, place, and time.     Comments: No facial drooping, speech normal Patient has some subjective decrease in sensation to the right side of the face, the right arm and the right leg as compared to the left.  The forehead is spared. Motor 5 out of 5 all extremities, finger-nose intact    ED Results / Procedures / Treatments   Labs (all labs ordered are listed, but only abnormal results are displayed) Labs Reviewed  CBC - Abnormal; Notable for the following components:      Result Value   Hemoglobin 12.1 (*)    MCH 25.2 (*)    All other components within normal limits  COMPREHENSIVE METABOLIC PANEL - Abnormal; Notable for the following components:   Glucose, Bld 141 (*)    All other components within normal limits  CBG MONITORING, ED - Abnormal; Notable for the following components:   Glucose-Capillary 152 (*)    All other components within normal limits  RESP PANEL BY RT-PCR (FLU A&B, COVID) ARPGX2  ETHANOL  PROTIME-INR  APTT  DIFFERENTIAL  RAPID URINE DRUG SCREEN, HOSP PERFORMED  URINALYSIS, ROUTINE W REFLEX MICROSCOPIC    EKG EKG Interpretation  Date/Time:  Sunday July 01 2021 19:05:02 EST Ventricular Rate:  101 PR Interval:  134 QRS  Duration: 82 QT Interval:  324 QTC Calculation: 420 R Axis:   69 Text Interpretation: Sinus tachycardia Nonspecific T wave abnormality Abnormal ECG When compared with ECG of 28-May-2021 11:44, PREVIOUS ECG IS PRESENT since last tracing no significant change Confirmed by Malvin Johns [phone]) on 07/01/2021 7:11:20 PM  Radiology CT ANGIO HEAD NECK W WO CM  Result Date: 07/01/2021 CLINICAL DATA:  Tingling sensation upon waking EXAM: CT ANGIOGRAPHY  HEAD AND NECK TECHNIQUE: Multidetector CT imaging of the head and neck was performed using the standard protocol during bolus administration of intravenous contrast. Multiplanar CT image reconstructions and MIPs were obtained to evaluate the vascular anatomy. Carotid stenosis measurements (when applicable) are obtained utilizing NASCET criteria, using the distal internal carotid diameter as the denominator. RADIATION DOSE REDUCTION: This exam was performed according to the departmental dose-optimization program which includes automated exposure control, adjustment of the mA and/or kV according to patient size and/or use of iterative reconstruction technique. CONTRAST:  OMNIPAQUE IOHEXOL 350 MG/ML SOLN COMPARISON:  Head CT 07/01/2021 CTA head neck 06/01/2021 FINDINGS: CTA NECK FINDINGS SKELETON: There is no bony spinal canal stenosis. No lytic or blastic lesion. OTHER NECK: Normal pharynx, larynx and major salivary glands. No cervical lymphadenopathy. Unremarkable thyroid gland. UPPER CHEST: No pneumothorax or pleural effusion. No nodules or masses. AORTIC ARCH: There is no calcific atherosclerosis of the aortic arch. There is no aneurysm, dissection or hemodynamically significant stenosis of the visualized portion of the aorta. Normal variant aortic arch branching pattern with the brachiocephalic and left common carotid arteries sharing a common origin. The visualized proximal subclavian arteries are widely patent. RIGHT CAROTID SYSTEM: Normal without aneurysm,  dissection or stenosis. LEFT CAROTID SYSTEM: Normal without aneurysm, dissection or stenosis. VERTEBRAL ARTERIES: Left dominant configuration. Both origins are clearly patent. There is no dissection, occlusion or flow-limiting stenosis to the skull base (V1-V3 segments). 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 Normal left V4. --Inferior cerebellar arteries: Normal. 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 --Posterior cerebral arteries (PCA): Normal. ANTERIOR CIRCULATION: --Intracranial internal carotid arteries: Normal. --Anterior cerebral arteries (ACA): Normal. Both A1 segments are present. Patent anterior communicating artery (a-comm). --Middle cerebral arteries (MCA): Normal. VENOUS SINUSES: As permitted by contrast timing, patent. ANATOMIC VARIANTS: Fetal origins of both posterior cerebral arteries. Review of the MIP images confirms the above findings. IMPRESSION: 1. No new arterial occlusion. 2. 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 3. Fetal origins of both posterior cerebral arteries. Electronically Signed   By: Deatra Robinson M.D.   On: 07/01/2021 20:27   CT Head Wo Contrast  Result Date: 07/01/2021 CLINICAL DATA:  Neurological deficit, acute, stroke suspected. Right sided paresthesia in tingling. EXAM: CT HEAD WITHOUT CONTRAST TECHNIQUE: Contiguous axial images were obtained from the base of the skull through the vertex without intravenous contrast. RADIATION DOSE REDUCTION: This exam was performed according to the departmental dose-optimization program which includes automated exposure control, adjustment of the mA and/or kV according to patient size and/or use of iterative reconstruction technique. COMPARISON:  Studies from January of this year. FINDINGS: Brain: Subtle evidence of low-density in the  right para median pons subsequent to the acute infarction of 1 month ago. No identifiable acute brainstem or cerebellar infarction. Cerebral hemispheres show mild chronic small-vessel ischemic change of white matter. No mass, hemorrhage, hydrocephalus or extra-axial collection. Vascular: There is atherosclerotic calcification of the major vessels at the base of the brain. Skull: Negative Sinuses/Orbits: Clear/normal Other: None IMPRESSION: No acute CT finding. Subtle low density within the right paramedian pons, sequela of acute infarction seen 1 month ago. Mild chronic small-vessel ischemic change of the cerebral hemispheric white matter. Electronically Signed   By: Paulina Fusi M.D.   On: 07/01/2021 19:22    Procedures Procedures    Medications Ordered in ED Medications  iohexol (OMNIPAQUE) 350 MG/ML injection 100 mL (100 mLs Intravenous Contrast Given 07/01/21 2010)    ED Course/ Medical Decision Making/ A&P                           Medical Decision Making Amount and/or Complexity of Data Reviewed Radiology: ordered.  Risk Prescription drug management. Decision regarding hospitalization.   Patient is a 61 year old male who presents with right-sided numbness.  He has no weakness.  No speech deficits or vision changes.  Given that he was only having numbness without other symptoms, code stroke was not activated as he would not be a candidate for tPA.  His symptoms resolved while he was here in the ED.  On chart review, he did have a recent pontine infarct in January.  He was not admitted for this given that he left the ED prior to getting the results of his MRI.  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  His echo was normal.  His labs today are nonconcerning.  Repeat CTA shows no significant change from prior.  I spoke with Dr. Rory Percy with neurology.  He felt that it would be better to plan on going ahead and admitting the patient.  He can get an  inpatient MRI and will possibly need an IR consult to see if there is anything that can be done about the basilar artery occlusion.  I discussed this with the patient and he is amenable to admission.  I spoke with Dr. Cyd Silence who will admit the patient to [facility].  Neurology will need to be notified of the patient's arrival once he gets to [facility].  I did asked the patient and he did verify that he has taken both his aspirin and Plavix today.  Final Clinical Impression(s) / ED Diagnoses Final diagnoses:  Right sided numbness    Rx / DC Orders ED Discharge Orders     None         Malvin Johns, MD 07/01/21 2126

## 2021-07-01 NOTE — ED Notes (Signed)
Warm blankets given  Pt resting quietly  No acute distress noted

## 2021-07-01 NOTE — ED Notes (Signed)
ED Provider at bedside. 

## 2021-07-02 ENCOUNTER — Observation Stay (HOSPITAL_COMMUNITY): Payer: Commercial Managed Care - HMO

## 2021-07-02 ENCOUNTER — Encounter (HOSPITAL_COMMUNITY): Payer: Self-pay | Admitting: Internal Medicine

## 2021-07-02 ENCOUNTER — Inpatient Hospital Stay (HOSPITAL_COMMUNITY): Payer: Commercial Managed Care - HMO

## 2021-07-02 DIAGNOSIS — E669 Obesity, unspecified: Secondary | ICD-10-CM | POA: Diagnosis present

## 2021-07-02 DIAGNOSIS — E782 Mixed hyperlipidemia: Secondary | ICD-10-CM

## 2021-07-02 DIAGNOSIS — E291 Testicular hypofunction: Secondary | ICD-10-CM

## 2021-07-02 DIAGNOSIS — Z8673 Personal history of transient ischemic attack (TIA), and cerebral infarction without residual deficits: Secondary | ICD-10-CM

## 2021-07-02 DIAGNOSIS — I639 Cerebral infarction, unspecified: Secondary | ICD-10-CM | POA: Insufficient documentation

## 2021-07-02 DIAGNOSIS — I69351 Hemiplegia and hemiparesis following cerebral infarction affecting right dominant side: Secondary | ICD-10-CM | POA: Diagnosis not present

## 2021-07-02 DIAGNOSIS — Z7902 Long term (current) use of antithrombotics/antiplatelets: Secondary | ICD-10-CM | POA: Diagnosis not present

## 2021-07-02 DIAGNOSIS — I779 Disorder of arteries and arterioles, unspecified: Secondary | ICD-10-CM

## 2021-07-02 DIAGNOSIS — R2689 Other abnormalities of gait and mobility: Secondary | ICD-10-CM | POA: Diagnosis not present

## 2021-07-02 DIAGNOSIS — Z20822 Contact with and (suspected) exposure to covid-19: Secondary | ICD-10-CM | POA: Diagnosis not present

## 2021-07-02 DIAGNOSIS — Z6830 Body mass index (BMI) 30.0-30.9, adult: Secondary | ICD-10-CM | POA: Diagnosis not present

## 2021-07-02 DIAGNOSIS — R202 Paresthesia of skin: Secondary | ICD-10-CM | POA: Diagnosis present

## 2021-07-02 DIAGNOSIS — R200 Anesthesia of skin: Secondary | ICD-10-CM | POA: Diagnosis not present

## 2021-07-02 DIAGNOSIS — I6501 Occlusion and stenosis of right vertebral artery: Secondary | ICD-10-CM | POA: Diagnosis not present

## 2021-07-02 DIAGNOSIS — Z79899 Other long term (current) drug therapy: Secondary | ICD-10-CM | POA: Diagnosis not present

## 2021-07-02 DIAGNOSIS — I10 Essential (primary) hypertension: Secondary | ICD-10-CM

## 2021-07-02 DIAGNOSIS — E118 Type 2 diabetes mellitus with unspecified complications: Secondary | ICD-10-CM

## 2021-07-02 DIAGNOSIS — M6281 Muscle weakness (generalized): Secondary | ICD-10-CM | POA: Diagnosis not present

## 2021-07-02 DIAGNOSIS — E119 Type 2 diabetes mellitus without complications: Secondary | ICD-10-CM | POA: Diagnosis not present

## 2021-07-02 DIAGNOSIS — I651 Occlusion and stenosis of basilar artery: Secondary | ICD-10-CM | POA: Diagnosis not present

## 2021-07-02 DIAGNOSIS — Z7984 Long term (current) use of oral hypoglycemic drugs: Secondary | ICD-10-CM | POA: Diagnosis not present

## 2021-07-02 HISTORY — PX: IR US GUIDE VASC ACCESS RIGHT: IMG2390

## 2021-07-02 HISTORY — PX: IR ANGIO VERTEBRAL SEL VERTEBRAL UNI R MOD SED: IMG5368

## 2021-07-02 HISTORY — PX: IR ANGIO VERTEBRAL SEL VERTEBRAL UNI L MOD SED: IMG5367

## 2021-07-02 HISTORY — PX: IR ANGIO INTRA EXTRACRAN SEL COM CAROTID INNOMINATE BILAT MOD SED: IMG5360

## 2021-07-02 LAB — HIV ANTIBODY (ROUTINE TESTING W REFLEX): HIV Screen 4th Generation wRfx: NONREACTIVE

## 2021-07-02 LAB — LIPID PANEL
Cholesterol: 111 mg/dL (ref 0–200)
HDL: 39 mg/dL — ABNORMAL LOW (ref >40)
LDL Cholesterol: 62 mg/dL (ref 0–99)
Total CHOL/HDL Ratio: 2.8 RATIO
Triglycerides: 50 mg/dL (ref <150)
VLDL: 10 mg/dL (ref 0–40)

## 2021-07-02 LAB — TSH: TSH: 1.533 u[IU]/mL (ref 0.350–4.500)

## 2021-07-02 LAB — GLUCOSE, CAPILLARY
Glucose-Capillary: 97 mg/dL (ref 70–99)
Glucose-Capillary: 153 mg/dL — ABNORMAL HIGH (ref 70–99)

## 2021-07-02 LAB — CBG MONITORING, ED: Glucose-Capillary: 121 mg/dL — ABNORMAL HIGH (ref 70–99)

## 2021-07-02 LAB — HEMOGLOBIN A1C
Hgb A1c MFr Bld: 6.4 % — ABNORMAL HIGH (ref 4.8–5.6)
Mean Plasma Glucose: 136.98 mg/dL

## 2021-07-02 IMAGING — XA IR ANGIO INTRA EXTRACRAN SEL COM CAROTID INNOMINATE BILAT MOD SE
1 of 2 series · 12 of 24 positions shown · IV contrast (IODINE)
Comparison: CTA of the brain and neck [DATE], and
[DATE].

CLINICAL DATA: New onset right-sided paresthesias involving the
face, arm and leg. Abnormal MRI of the brain, and CT angiogram of
the head and neck.

EXAM:
BILATERAL COMMON CAROTID AND INNOMINATE ANGIOGRAPHY
TECHNIQUE: Informed written consent was obtained from the patient after a
thorough discussion of the procedural risks, benefits and
alternatives. All questions were addressed. Maximal Sterile Barrier
Technique was utilized including caps, mask, sterile gowns, sterile
gloves, sterile drape, hand hygiene and skin antiseptic. A timeout
was performed prior to the initiation of the procedure.

[Series 300: dr. (person_name) · 12 of 313 slices shown]
[im 1/313]
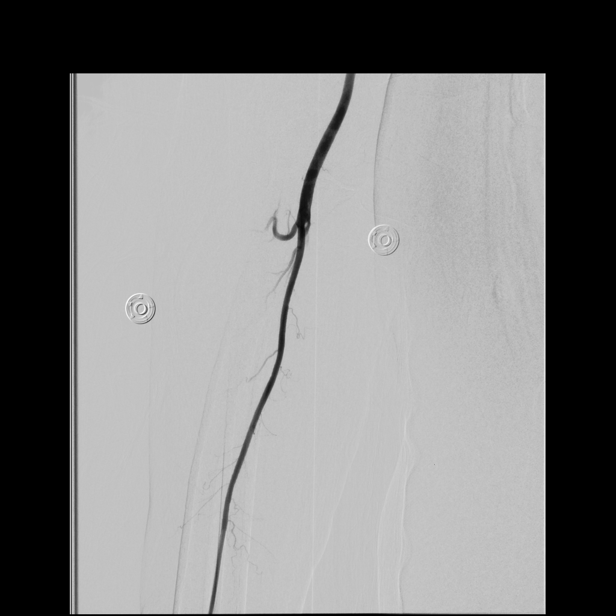
[im 29/313]
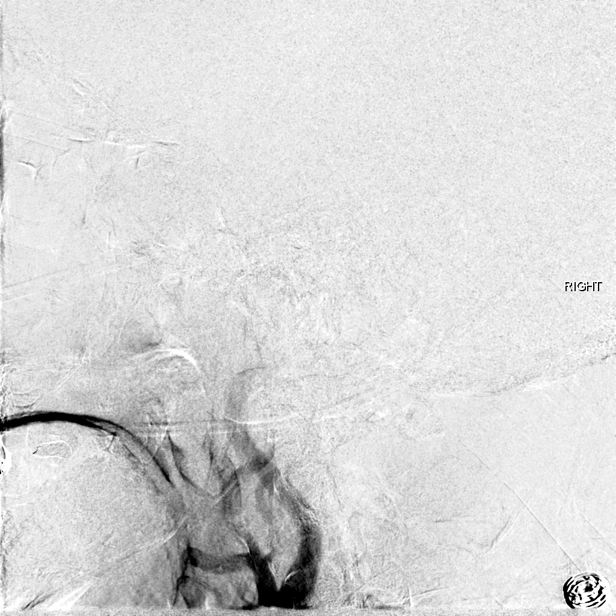
[im 57/313]
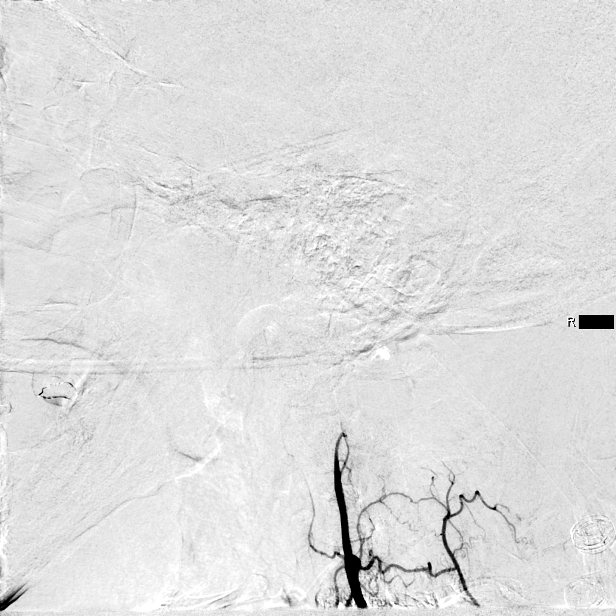
[im 86/313]
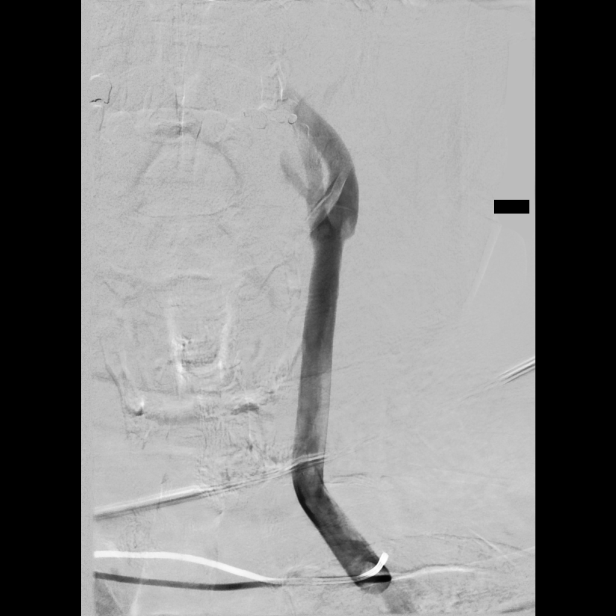
[im 114/313]
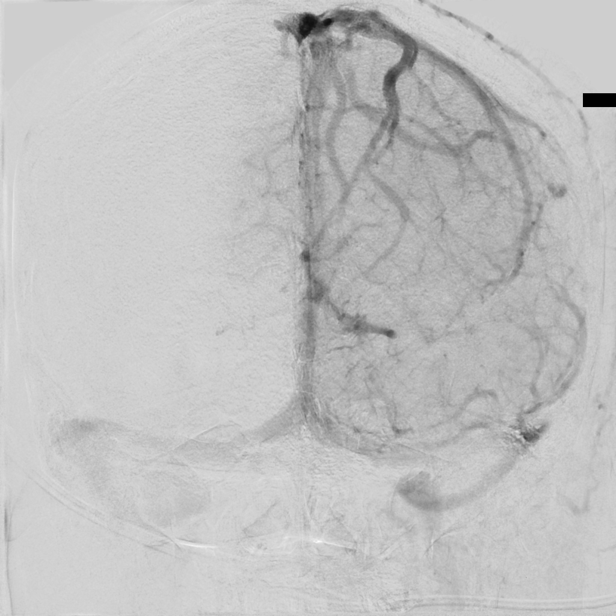
[im 142/313]
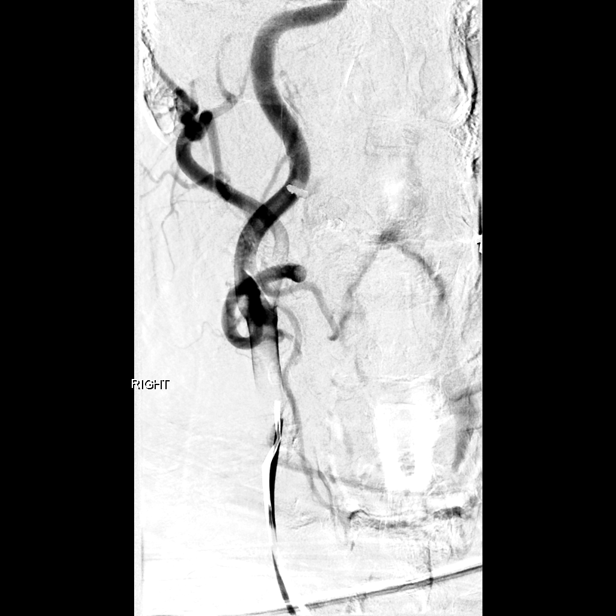
[im 171/313]
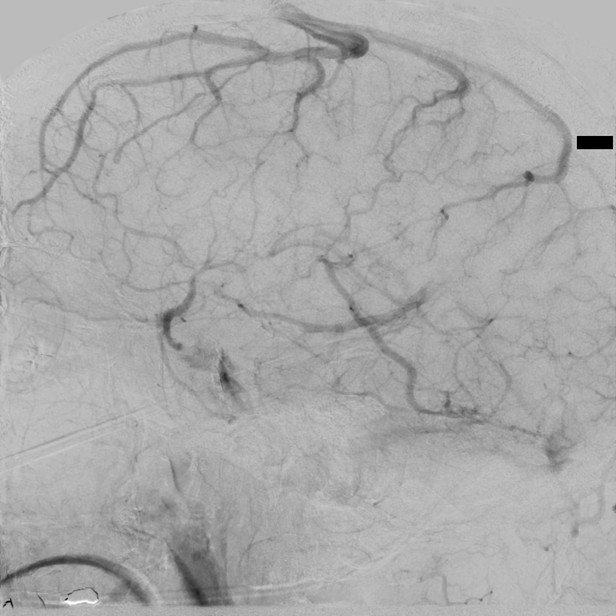
[im 199/313]
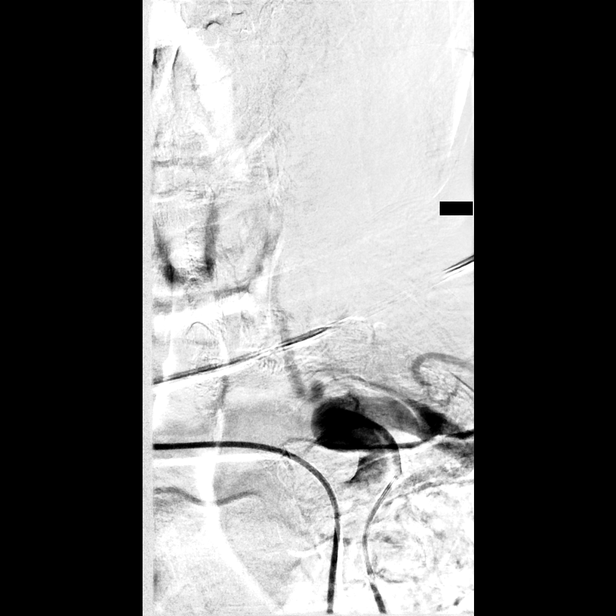
[im 227/313]
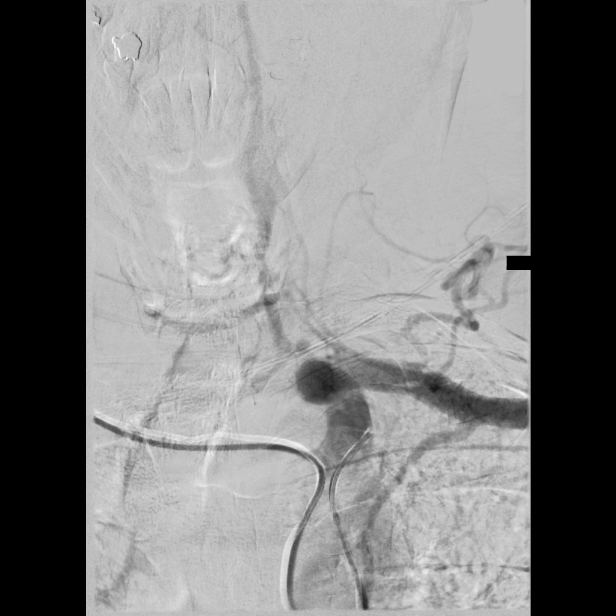
[im 256/313]
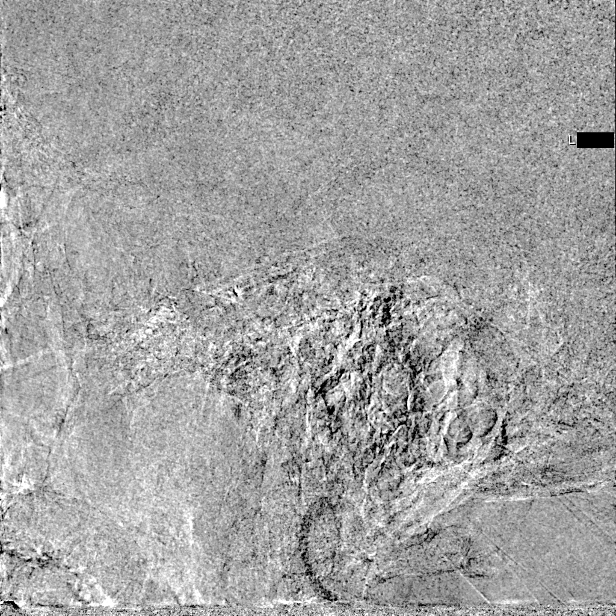
[im 284/313]
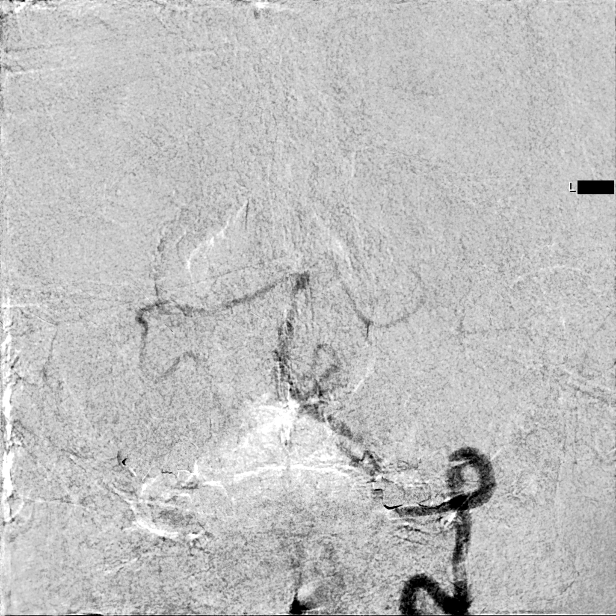
[im 313/313]
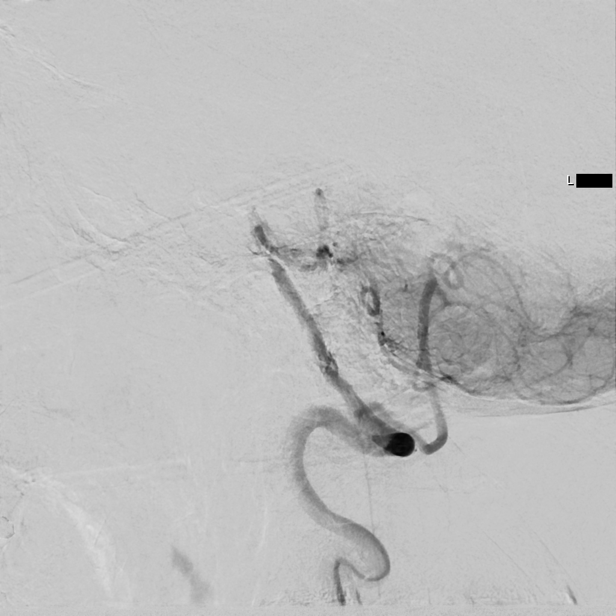

[12 of 24 positions shown; findings below may reference images not displayed]

MEDICATIONS:
Heparin [0A] units intra-arterial. No antibiotic was administered
within 1 hour of the procedure.

ANESTHESIA/SEDATION:
Versed 1 mg IV; Fentanyl 25 mcg IV

Moderate Sedation Time:  50 minutes

The patient was continuously monitored during the procedure by the
interventional radiology nurse under my direct supervision.

CONTRAST:  Omnipaque 300 approximately 80 mL.

FLUOROSCOPY TIME:  Fluoroscopy Time: 9 minutes 24 seconds ([0A]
mGy).

COMPLICATIONS:
None immediate.
The right forearm to the wrist was prepped and draped in the usual
sterile manner. The right radial artery was then identified with
ultrasound, and its morphology documented permanently in the
radiology PACS system.

A dorsal palmar anastomosis was verified to be present. Using
ultrasound guidance and micropuncture set access into the right
radial artery was obtained, followed by advancement of a [DATE] French
radial sheath. The obturator, and the guidewire were removed. Good
aspiration obtained from the side port the radial sheath. A cocktail
of [0A] units of heparin, 2.5 mg of verapamil, and 200 mcg of
nitroglycerin was then infused in diluted form without event. A
right radial arteriogram was performed. Over a 0.035 inch Roadrunner
guidewire, a 5 TAKAKO 2 diagnostic catheter was then
advanced to the aortic arch region, and selectively positioned in
the right vertebral artery, the right common carotid artery, the
left common carotid artery and the left vertebral artery.

A wrist band was then applied at the radial puncture site for
hemostasis. Distal right radial pulse was verified be present.
FINDINGS: The right vertebral artery origin is patent.

The vessel is seen to slowly ascend to the cranial skull base where
there is complete occlusion at the level of C2.

The left common carotid arteriogram demonstrates the left external
carotid artery and its major branches to be widely patent.

The left internal carotid artery at the bulb to the cranial skull
base is widely patent.

The petrous, the cavernous and the supraclinoid segments demonstrate
wide patency.

The left middle cerebral artery and the left anterior cerebral
artery opacify into the capillary and venous phases.

Left posterior communicating artery is seen opacifying the left
posterior cerebral artery distribution, and the left superior
cerebellar artery. Incidentally a high-riding right internal jugular
bulb is evident.

The right common carotid arteriogram demonstrates the external
carotid artery and its major branches to be widely patent.

The right internal carotid artery at the bulb to the cranial skull
base is widely patent as well.

The petrous, the cavernous and the supraclinoid segments demonstrate
wide patency.

The right middle cerebral artery and the right anterior cerebral
artery opacify into the capillary and venous phases.

A prominent right posterior communicating artery is seen opacifying
the right superior cerebellar artery and the right posterior
cerebral artery distribution. Retrograde opacification of the distal
basilar arteries evident leading to complete occlusion, without
evidence of antegrade clearance of contrast.

The left vertebral artery origin demonstrated wide patency with mild
tortuosity at its origin.

The vessel is seen to opacify to the cranial skull base. Patency is
maintained of the left posterior-inferior cerebellar artery with
mild stenosis just distal to this.

Significant stenosis is seen of the basilar artery just proximal to
the right anterior inferior cerebellar artery. Complete angiographic
occlusion is seen of the basilar artery just distal to the origins
of the anterior inferior cerebellar arteries. Moderate to severe
narrowing of the proximal left anterior inferior cerebellar artery
with moderate intracranial arteriosclerotic changes is evident.

The delayed images demonstrate no evidence of antegrade string sign.
IMPRESSION: Angiographic complete occlusion of the basilar artery just distal to
the origins of the anterior-inferior cerebellar arteries. Partial
retrograde opacification of the distal basilar artery from the right
posterior communicating artery without antegrade washout.

Angiographic occlusion of the right vertebral artery at the level of
C2.

PLAN:
Findings reviewed with the patient and referring neurologist.

Follow-up with CT angiogram of the head and neck in 3 months time.

## 2021-07-02 IMAGING — MR MR HEAD W/O CM
12 of 13 series · 44 of 48 positions shown · non-contrast
Comparison: Noncontrast head CT and CT angiogram head/neck
[DATE]. Brain MRI [DATE].

CLINICAL DATA: Provided history: Neuro deficit, acute, stroke
suspected. Additional history provided: Neurological deficit, acute,
stroke suspected. Right-sided paresthesias/tingling.

EXAM:
MRI HEAD WITHOUT CONTRAST
TECHNIQUE: Multiplanar, multiecho pulse sequences of the brain and surrounding
structures were obtained without intravenous contrast.

[Series 5: DWI · axial · 3.0mm · 0.88mm/px · z∈[-87,+53]mm · 7 of 96 slices shown (1 of 4)]
[im 1/96]
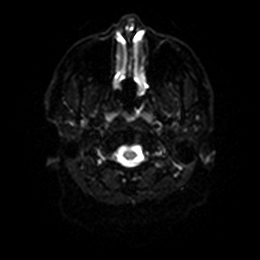
[im 16/96]
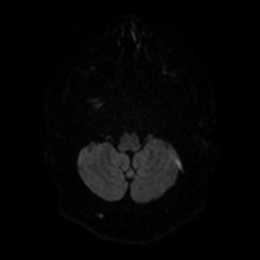
[im 32/96]
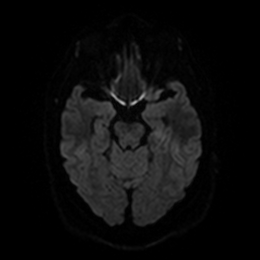
[im 48/96]
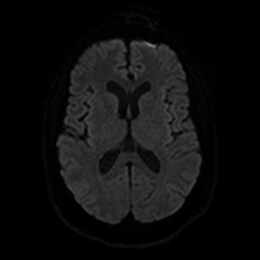
[im 64/96]
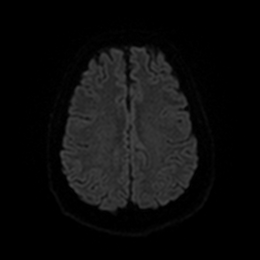
[im 80/96]
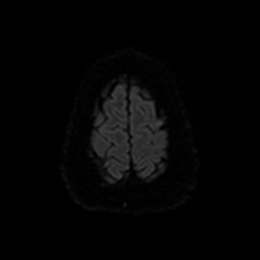
[im 96/96]
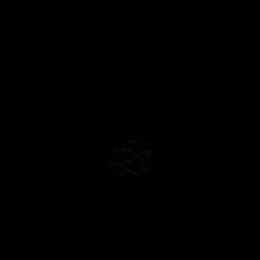

[Series 6: DWI · axial · 3.0mm · 0.88mm/px · z∈[-87,+53]mm · 4 of 48 slices shown (2 of 4)]
[im 1/48]
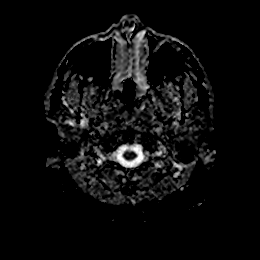
[im 16/48]
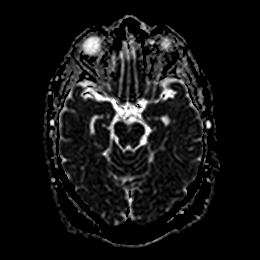
[im 32/48]
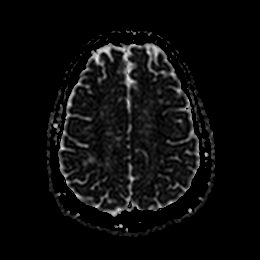
[im 48/48]
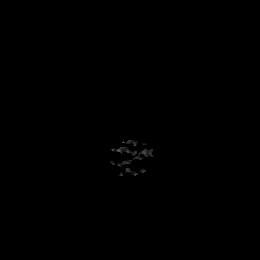

[Series 7: DWI · coronal · 4.0mm · 0.88mm/px · 5 of 64 slices shown (3 of 4)]
[im 1/64]
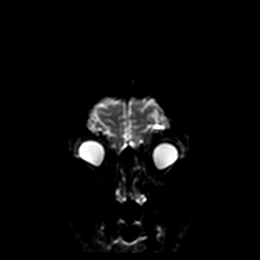
[im 16/64]
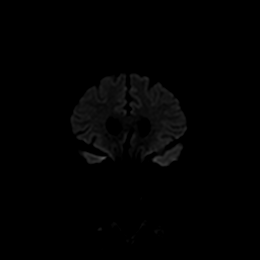
[im 32/64]
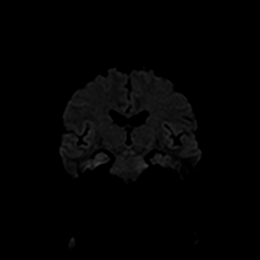
[im 48/64]
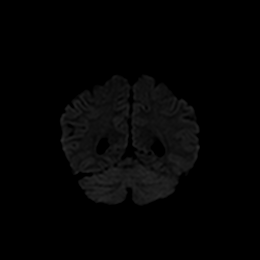
[im 64/64]
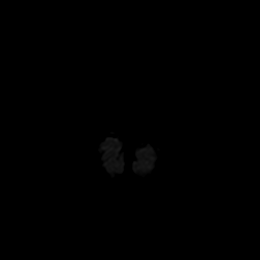

[Series 8: DWI · coronal · 4.0mm · 0.88mm/px · 2 of 32 slices shown (4 of 4)]
[im 1/32]
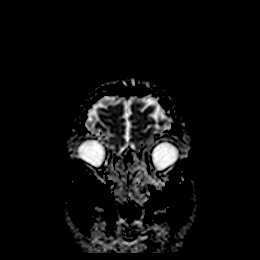
[im 32/32]
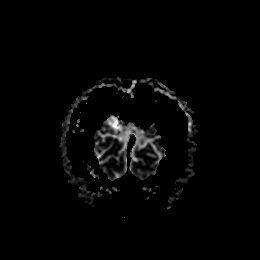

[Series 9: T1 · sagittal · 5.0mm · 0.75mm/px · 2 of 23 slices shown]
[im 1/23]
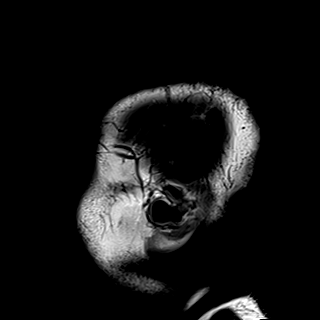
[im 23/23]
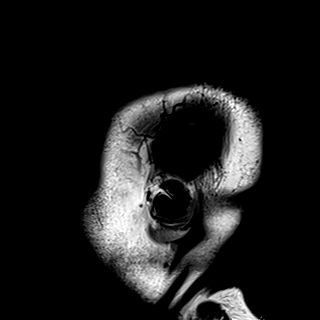

[Series 10: T2 · axial · 5.0mm · 0.72mm/px · z∈[-99,+45]mm · 2 of 24 slices shown (1 of 2)]
[im 1/24]
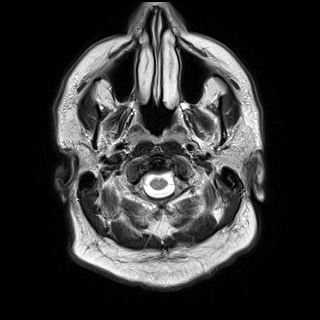
[im 24/24]
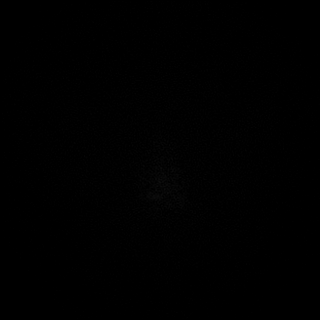

[Series 11: FLAIR · axial · 5.0mm · 0.45mm/px · z∈[-99,+45]mm · 2 of 25 slices shown]
[im 1/25]
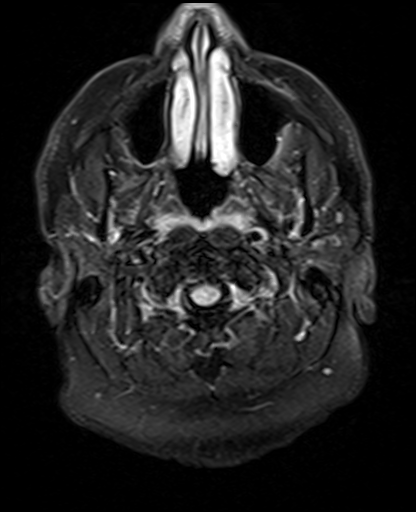
[im 25/25]
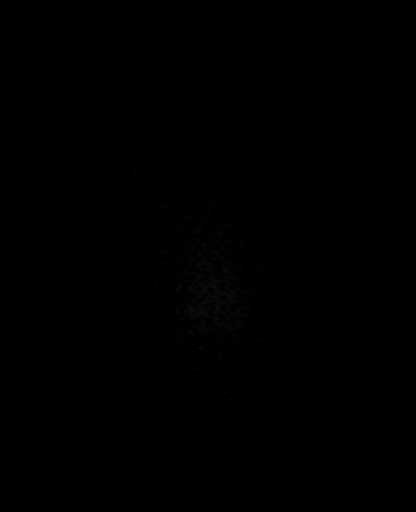

[Series 12: mag_images · axial · 3.0mm · 0.90mm/px · z∈[-117,+60]mm · 5 of 60 slices shown]
[im 1/60]
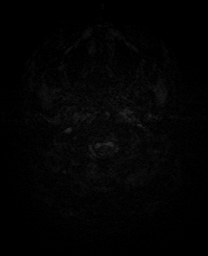
[im 15/60]
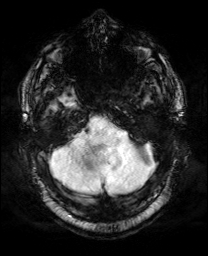
[im 30/60]
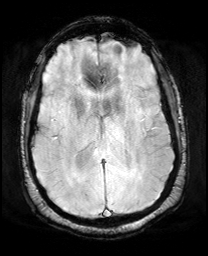
[im 45/60]
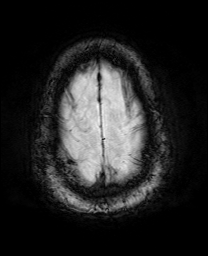
[im 60/60]
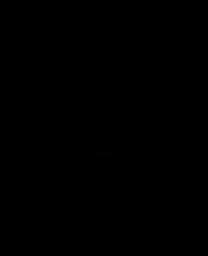

[Series 13: pha_images · axial · 3.0mm · 0.90mm/px · z∈[-117,+54]mm · 4 of 58 slices shown]
[im 1/58]
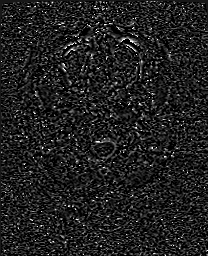
[im 20/58]
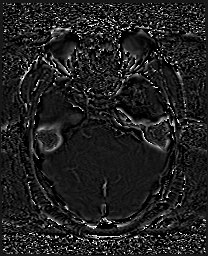
[im 39/58]
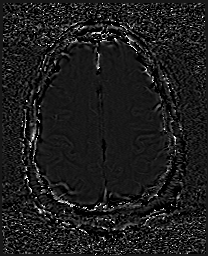
[im 58/58]
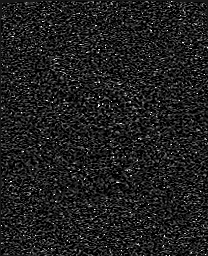

[Series 14: swi_images · axial · 3.0mm · 0.90mm/px · z∈[-117,+60]mm · 5 of 60 slices shown]
[im 1/60]
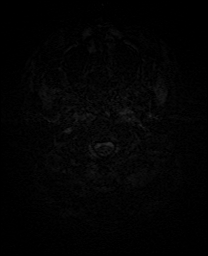
[im 15/60]
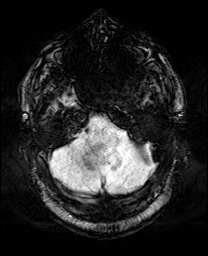
[im 30/60]
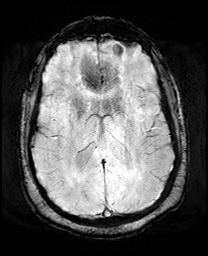
[im 45/60]
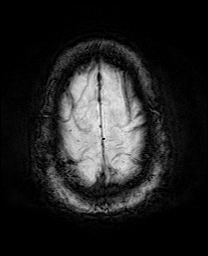
[im 60/60]
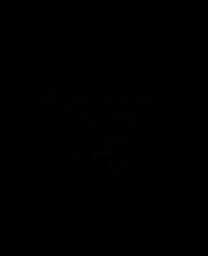

[Series 15: mip_images(sw) · axial · 24.0mm · 0.90mm/px · z∈[-107,+49]mm · 4 of 53 slices shown]
[im 1/53]
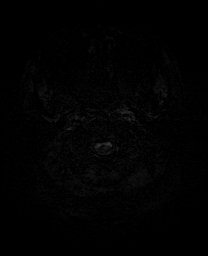
[im 18/53]
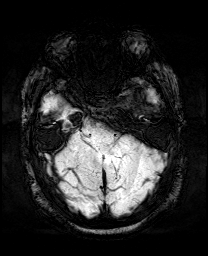
[im 35/53]
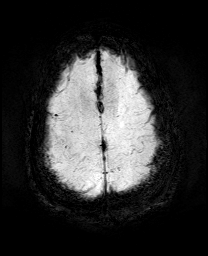
[im 53/53]
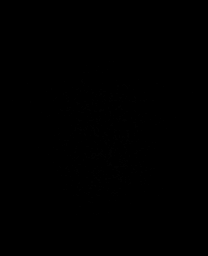

[Series 17: T2 · coronal · 5.0mm · 0.34mm/px · 2 of 29 slices shown (2 of 2)]
[im 1/29]
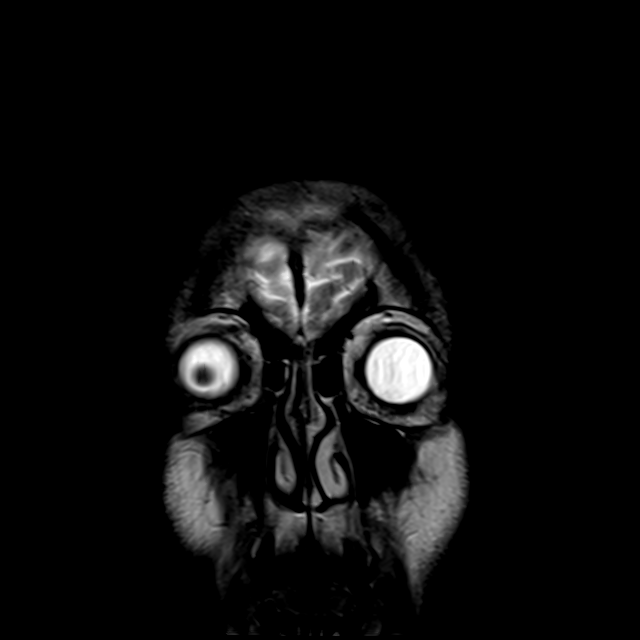
[im 29/29]
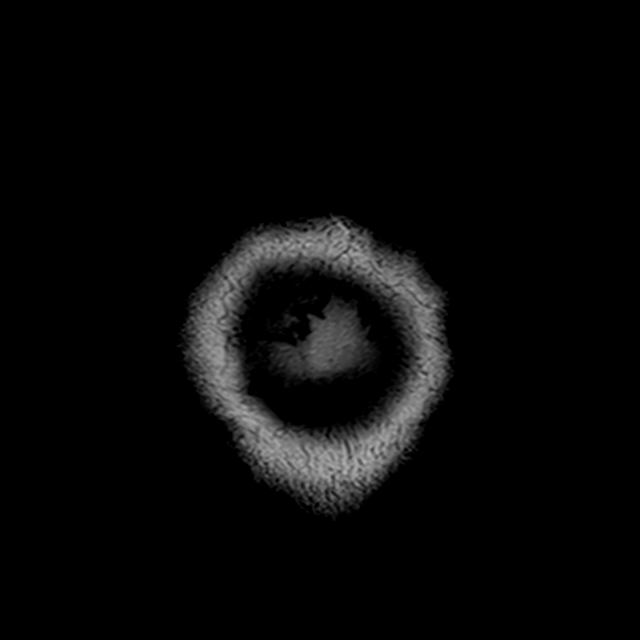

[44 of 48 positions shown; findings below may reference images not displayed]

FINDINGS: Brain:

Cerebral volume is normal.

Two acute infarcts within the left pons measuring up to 15 mm (for
instance as seen on series 5, images 60 and 61).

Known chronic infarct within the right pons.

Mild to moderate multifocal T2 FLAIR hyperintense signal abnormality
within the cerebral white matter, nonspecific but compatible chronic
small vessel ischemic disease.

No evidence of an intracranial mass.

No chronic intracranial blood products.

No extra-axial fluid collection.

No midline shift.

Vascular: Known occlusion of the V4 right vertebral artery and
proximal basilar artery. Flow voids otherwise preserved within the
proximal large arterial vessels.

Skull and upper cervical spine: No focal suspicious marrow lesion.

Sinuses/Orbits: Visualized orbits show no acute finding. Trace
mucosal thickening within the bilateral ethmoid and sphenoid
sinuses.
IMPRESSION: Two acute infarcts within the left pons, measuring up to 15 mm.

Known chronic infarct within the right pons.

Mild-to-moderate chronic small vessel ischemic changes within the
cerebral white matter, stable from the brain MRI of [DATE].

## 2021-07-02 MED ORDER — MIDAZOLAM HCL 2 MG/2ML IJ SOLN
INTRAMUSCULAR | Status: AC
Start: 2021-07-02 — End: 2021-07-03
  Filled 2021-07-02: qty 2

## 2021-07-02 MED ORDER — LIDOCAINE HCL 1 % IJ SOLN
INTRAMUSCULAR | Status: AC
  Filled 2021-07-02: qty 20

## 2021-07-02 MED ORDER — ONDANSETRON HCL 4 MG/2ML IJ SOLN
4.0000 mg | INTRAMUSCULAR | Status: DC | PRN
Start: 2021-07-02 — End: 2021-07-03

## 2021-07-02 MED ORDER — ENOXAPARIN SODIUM 40 MG/0.4ML IJ SOSY
40.0000 mg | PREFILLED_SYRINGE | INTRAMUSCULAR | Status: DC
  Administered 2021-07-02 – 2021-07-03 (×2): 40 mg via SUBCUTANEOUS
  Filled 2021-07-02 (×2): qty 0.4

## 2021-07-02 MED ORDER — NITROGLYCERIN 1 MG/10 ML FOR IR/CATH LAB: INTRA_ARTERIAL | Status: AC | PRN

## 2021-07-02 MED ORDER — ATORVASTATIN CALCIUM 80 MG PO TABS
80.0000 mg | ORAL_TABLET | Freq: Every day | ORAL | Status: DC
  Administered 2021-07-02 – 2021-07-03 (×2): 80 mg via ORAL
  Filled 2021-07-02: qty 2
  Filled 2021-07-02: qty 1

## 2021-07-02 MED ORDER — FENTANYL CITRATE (PF) 100 MCG/2ML IJ SOLN
INTRAMUSCULAR | Status: AC
Start: 2021-07-02 — End: 2021-07-03
  Filled 2021-07-02: qty 2

## 2021-07-02 MED ORDER — TRAZODONE HCL 50 MG PO TABS
25.0000 mg | ORAL_TABLET | Freq: Every evening | ORAL | Status: DC | PRN
Start: 2021-07-02 — End: 2021-07-03

## 2021-07-02 MED ORDER — ACETAMINOPHEN 325 MG PO TABS: 650.0000 mg | ORAL_TABLET | ORAL | Status: DC | PRN

## 2021-07-02 MED ORDER — HEPARIN SODIUM (PORCINE) 1000 UNIT/ML IJ SOLN
INTRAMUSCULAR | Status: AC
  Filled 2021-07-02: qty 10

## 2021-07-02 MED ORDER — VERAPAMIL HCL 2.5 MG/ML IV SOLN
INTRAVENOUS | Status: AC
  Filled 2021-07-02: qty 2

## 2021-07-02 MED ORDER — SODIUM CHLORIDE 0.9 % IV SOLN: Freq: Once | INTRAVENOUS | Status: AC

## 2021-07-02 MED ORDER — CLOPIDOGREL BISULFATE 75 MG PO TABS
75.0000 mg | ORAL_TABLET | Freq: Every day | ORAL | Status: DC
Start: 2021-07-02 — End: 2021-07-03
  Administered 2021-07-02 – 2021-07-03 (×2): 75 mg via ORAL
  Filled 2021-07-02 (×2): qty 1

## 2021-07-02 MED ORDER — ACETAMINOPHEN 650 MG RE SUPP: 650.0000 mg | RECTAL | Status: DC | PRN

## 2021-07-02 MED ORDER — INSULIN ASPART 100 UNIT/ML IJ SOLN: 0.0000 [IU] | Freq: Three times a day (TID) | INTRAMUSCULAR | Status: DC

## 2021-07-02 MED ORDER — NITROGLYCERIN 1 MG/10 ML FOR IR/CATH LAB
INTRA_ARTERIAL | Status: AC
  Filled 2021-07-02: qty 10

## 2021-07-02 MED ORDER — FENTANYL CITRATE (PF) 100 MCG/2ML IJ SOLN
INTRAMUSCULAR | Status: AC | PRN
  Administered 2021-07-02: 25 ug via INTRAVENOUS

## 2021-07-02 MED ORDER — ASPIRIN 81 MG PO CHEW
81.0000 mg | CHEWABLE_TABLET | Freq: Every day | ORAL | Status: DC
  Administered 2021-07-02 – 2021-07-03 (×2): 81 mg via ORAL
  Filled 2021-07-02 (×2): qty 1

## 2021-07-02 MED ORDER — STROKE: EARLY STAGES OF RECOVERY BOOK
Freq: Once | Status: AC
  Filled 2021-07-02: qty 1

## 2021-07-02 MED ORDER — MIDAZOLAM HCL 2 MG/2ML IJ SOLN
INTRAMUSCULAR | Status: AC | PRN
  Administered 2021-07-02: 1 mg via INTRAVENOUS

## 2021-07-02 MED ORDER — SODIUM CHLORIDE 0.9 % IV SOLN: INTRAVENOUS | Status: DC

## 2021-07-02 MED ORDER — IOHEXOL 300 MG/ML  SOLN
100.0000 mL | Freq: Once | INTRAMUSCULAR | Status: AC | PRN
  Administered 2021-07-02: 50 mL via INTRA_ARTERIAL

## 2021-07-02 MED ORDER — IOHEXOL 300 MG/ML  SOLN
100.0000 mL | Freq: Once | INTRAMUSCULAR | Status: AC | PRN
  Administered 2021-07-02: 80 mL via INTRA_ARTERIAL

## 2021-07-02 MED ORDER — MAGNESIUM HYDROXIDE 400 MG/5ML PO SUSP
30.0000 mL | Freq: Every day | ORAL | Status: DC | PRN
  Filled 2021-07-02: qty 30

## 2021-07-02 MED ORDER — HYDRALAZINE HCL 20 MG/ML IJ SOLN: 10.0000 mg | INTRAMUSCULAR | Status: DC | PRN

## 2021-07-02 NOTE — H&P (Addendum)
History and Physical    Patient: Joshua Harrington LGX:[id number] DOB: 05/11/1960 DOA: 07/01/2021 DOS: the patient was seen and examined on 07/02/2021 PCP: [name], MD  Patient coming from: via private vehicle   Chief Complaint:  Chief Complaint  Patient presents with   Tingling    HPI: Joshua Harrington is a 61 y.o. right-handed male with medical history significant of hypertension, hyperlipidemia, CVA, diabetes mellitus type 2 with complaints of tingling of the right side of his face, arm, and leg.  Last noted to be normal around 11 PM 2 days ago.  Symptoms initially started around 12 PM yesterday when he woke up.  Symptoms momentarily went away and returned around 5 PM later on that day.  Denies having any change in vision, change in speech, facial droop, headache, dizziness, chest pain, palpitations, shortness of breath, cough, fever, chills, nausea, vomiting, diarrhea, or dysuria.  He does not smoke, drink alcohol, or doing illicit drugs.  Patient had just recently been diagnosed with an acute pontine stroke on 05/28/2021 by MRI here at [facility] after presenting with left-sided left-sided weakness.  It was reported that the patient left the hospital prior to being seen.  However, patient reports after his MRI he asked if there was anything else that he needed to do and was told there was not.  He did not find out that he had a stroke until following up with his primary.  He was referred to [facility] neurology cardiology.  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  He had been placed on aspirin and Plavix which he had continued to take.  Echocardiogram from 2/9 noted EF of 60-65% with no signs of intra-atrial shunt or valvular disease.  Since that time patient reports that his strength on his left side had improved almost back to normal.  He had self stopped the testosterone cream  after the initial stroke, but had recently just restarted it.  He states that he had been on the testosterone cream for less than a year.  Upon admission into the emergency department patient was seen to be afebrile with blood pressures 135/85 to 157/105, and all other vital signs maintained.  CT scan of the head did not note any acute abnormality, noted a subtle low-density within right paramedian pons to be sequela of prior infarction along, and mild chronic small vessel ischemic disease.  He was outside of the window for tPA.  Labs appear to be similar to previous.  UDS negative. CTA of the head and neck was unchanged from previous.  Influenza and COVID-19 screening were negative.  TRH called to admit.  Review of Systems: As mentioned in the history of present illness. All other systems reviewed and are negative. Past Medical History:  Diagnosis Date   Diabetes mellitus without complication (HCC)    Hernia    High cholesterol    Hypertension    Past Surgical History:  Procedure Laterality Date   ABDOMINAL SURGERY     HERNIA REPAIR     VASECTOMY     Social History:  reports that he has never smoked. He has never used smokeless tobacco. He reports that he does not drink alcohol and does not use drugs.  No Known Allergies  Family History  Problem Relation Age of Onset   Cancer Mother        Stomach   Cirrhosis Father  Prior to Admission medications   Medication Sig Start Date End Date Taking? Authorizing Provider  amLODipine-olmesartan (AZOR) 5-20 MG tablet Take 1 tablet by mouth daily.  10/20/21 Yes [provider]  aspirin EC 81 MG tablet Take 81 mg by mouth daily. Swallow whole.   Yes [provider]  atorvastatin (LIPITOR) 80 MG tablet Take 1 tablet by mouth daily. 05/30/21  Yes [provider]  Cholecalciferol (VITAMIN D3) 125 MCG (5000 UT) CAPS Take 1 capsule by mouth daily.   Yes [provider]  clopidogrel (PLAVIX) 75 MG tablet Take 1  tablet (75 mg total) by mouth daily. 06/21/21  Yes Patwardhan, Manish J, MD  fish oil-omega-3 fatty acids 1000 MG capsule Take 1 g by mouth daily. Take 4 daily   Yes [provider]  metFORMIN (GLUCOPHAGE) 500 MG tablet Take 500 mg by mouth 2 (two) times daily. 02/18/20  Yes [provider]  Testosterone 25 MG/2.5GM (1%) GEL Apply 1 application topically daily.   Yes [provider]  Turmeric 450 MG CAPS Take 450 mg by mouth daily.   Yes [provider]  ketoconazole (NIZORAL) 2 % cream Apply 1 application topically daily. Apply to both feet and between toes once daily for 6 weeks Patient not taking: Reported on 07/02/2021 03/18/18   Freddie Breech, DPM    Physical Exam: Vitals:   07/02/21 0330 07/02/21 0400 07/02/21 0430 07/02/21 0551  BP: (!) 137/93 135/85 (!) 142/91 (!) 148/109  Pulse: 71 73 87 73  Resp: 17 16 16 18   Temp:    97.9 F (36.6 C)  TempSrc:    Oral  SpO2: 97% 97% 100% 100%  Weight:      Height:        Constitutional: Middle-age male no acute distress Eyes: PERRL, lids and conjunctivae normal.  Patient wearing glasses ENMT: Mucous membranes are moist. Posterior pharynx clear of any exudate or lesions. Normal dentition.  Neck: normal, supple, no masses, no thyromegaly Respiratory: clear to auscultation bilaterally, no wheezing, no crackles. Normal respiratory effort. No accessory muscle use.  Cardiovascular: Regular rate and rhythm, no murmurs / rubs / gallops. No extremity edema. 2+ pedal pulses.  Abdomen: no tenderness, no masses palpated. Bowel sounds positive.  Musculoskeletal: no clubbing / cyanosis. No joint deformity upper and lower extremities. Good ROM, no contractures. Normal muscle tone.  Skin: no rashes, lesions, ulcers. No induration Neurologic: CN 2-12 grossly intact.  Abnormal sensation on the right side.  No facial droop.  Strength 5/5 on the right upper and lower extremity.  Strength 4+/5 on the left upper and lower  extremity.  Finger-to-nose testing is slightly off on the left upper extremity. psychiatric: Normal judgment and insight. Alert and oriented x 3. Normal mood.    Data Reviewed:  EKG noted sinus tachycardia 101 bpm appears similar to previous  Assessment and Plan: * Right-sided numbness secondary to CVA (cerebral vascular accident) (HCC)- (present on admission) Patient presented with complaints of right-sided numbness which started yesterday.  Initial CT scan of the brain did not note any acute abnormality.  He was out of the window for tPA.  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  Echocardiogram from 2/9 noted normal EF of 60-65% without signs of interatrial shunt or systolic dysfunction.  Risk factors include hypertension, hyperlipidemia, diabetes mellitus type 2, and transdermal testosterone. -Admit to telemetry bed -Stroke order set initiated -Neuro checks -Follow-up MRI of  the brain resulted showing 2 acute infarcts within the left pons measuring up to 15 mm -Check Hemoglobin A1c(6.4) and lipid panel in a.m. -PT/OT/Speech to evaluate and treat -ASA, Plavix, and high-dose statin -Follow-up telemetry.  Patient is followed by [facility] cardiology may need to be set up with Holter monitor and/or transesophageal echocardiogram -Social work consult -Appreciate neurology consultative services, will follow-up any additional recommendations -Dr. Corliss Skains of neurointerventional radiology consulted,will follow-up for any further recommendation   Vertebral artery disease (HCC) As noted above.  History of CVA (cerebrovascular accident) Patient just recently found to have a acute right pontine stroke 1/23.  Work-up was done in outpatient setting.  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  Noted normal EF of 60 to 65% without signs of  interatrial shunt or diastolic dysfunction on 2/9. -Continue statin, Plavix, aspirin  Type 2 diabetes mellitus with complication, without long-term current use of insulin (HCC)- (present on admission) No recent hemoglobin A1c available.  Home medication regimen included metformin 500 mg twice daily. -Follow-up hemoglobin A1c -Hold metformin -CBGs before every meal with sensitive SSI -Consider discontinuing sliding scale of insulin if blood sugars remain well controlled  Essential hypertension- (present on admission) On admission blood pressures elevated up to 157/105.  Home blood pressure medication regimen includes amlodipine-olmesartan 5-20 mg daily. -Hold home blood pressure medication to allow for permissive hypertension up to systolic blood pressure 220 -Hydralazine IV as needed for systolic blood pressure greater than 220 or diastolic blood pressure greater than 110  Mixed hyperlipidemia- (present on admission) Home medication regimen included atorvastatin 80 mg daily. -Follow-up lipid panel -Continue high-dose statin  Obesity (BMI 30-39.9)- (present on admission) BMI 30.85 kg/m. -Continue to counsel on need of dietary and lifestyle modifications  Male hypogonadism- (present on admission) Patient on testosterone gel. -Would like to recommend discontinuation of testosterone gel as one of the serious complications includes stroke, but would suggest neurology input.    Advance Care Planning:   Code Status: Full Code   Consults: Neurology  Family Communication: Wife updated over the phone  Severity of Illness: The appropriate patient status for this patient is INPATIENT. Inpatient status is judged to be reasonable and necessary in order to provide the required intensity of service to ensure the patient's safety. The patient's presenting symptoms, physical exam findings, and initial radiographic and laboratory data in the context of their chronic comorbidities is felt to place  them at high risk for further clinical deterioration. Furthermore, it is not anticipated that the patient will be medically stable for discharge from the hospital within 2 midnights of admission.   * I certify that at the point of admission it is my clinical judgment that the patient will require inpatient hospital care spanning beyond 2 midnights from the point of admission due to high intensity of service, high risk for further deterioration and high frequency of surveillance required.*  Author: Clydie Braun, MD 07/02/2021 7:17 AM  For on call review [email or web address].

## 2021-07-02 NOTE — Evaluation (Signed)
Occupational Therapy Evaluation Patient Details Name: Joshua Harrington MRN: [id number] DOB: [birth year] Today's Date: 07/02/2021   History of Present Illness Pt is a 61 y/o male presenting on 2/26 with R sided tingling and numbness. CT negative, MRI pending. Noted R pontine CVA 1 month ago, but left hospital without workup. PMH inculdes: DM, HTN, hernia.   Clinical Impression   PTA patient independent and driving. Admitted for above and presents with impaired balance, decreased coordination and decreased safety awareness.  He currently requires min guard for transfers, min assist for mobility and up to min guard for ADLS.  He will benefit from continued OT services acutely and after dc at OP OT level to optimize independence, safety and return to PLOF. Will follow.      Recommendations for follow up therapy are one component of a multi-disciplinary discharge planning process, led by the attending physician.  Recommendations may be updated based on patient status, additional functional criteria and insurance authorization.   Follow Up Recommendations  Outpatient OT    Assistance Recommended at Discharge Intermittent Supervision/Assistance  Patient can return home with the following A little help with walking and/or transfers;A little help with bathing/dressing/bathroom;Assistance with cooking/housework;Assist for transportation    Functional Status Assessment  Patient has had a recent decline in their functional status and demonstrates the ability to make significant improvements in function in a reasonable and predictable amount of time.  Equipment Recommendations  None recommended by OT    Recommendations for Other Services       Precautions / Restrictions Precautions Precautions: Fall Restrictions Weight Bearing Restrictions: No      Mobility Bed Mobility Overal bed mobility: Modified Independent             General bed mobility comments: no assist required    Transfers                           Balance Overall balance assessment: Needs assistance Sitting-balance support: No upper extremity supported, Feet supported Sitting balance-Leahy Scale: Good     Standing balance support: During functional activity, No upper extremity supported Standing balance-Leahy Scale: Fair                             ADL either performed or assessed with clinical judgement   ADL Overall ADL's : Needs assistance/impaired     Grooming: Min guard;Sitting           Upper Body Dressing : Set up;Sitting   Lower Body Dressing: Min guard;Sit to/from stand   Toilet Transfer: Min guard;Ambulation;Minimal assistance Toilet Transfer Details (indicate cue type and reason): min guard to min assist for mobility         Functional mobility during ADLs: Minimal assistance;Cueing for safety General ADL Comments: min assist for dynamic balance     Vision Baseline Vision/History: 1 Wears glasses (distance) Vision Assessment?: No apparent visual deficits     Perception     Praxis      Pertinent Vitals/Pain Pain Assessment Pain Assessment: No/denies pain     Hand Dominance Right   Extremity/Trunk Assessment Upper Extremity Assessment Upper Extremity Assessment: RUE deficits/detail;LUE deficits/detail RUE Deficits / Details: mild decreased coodination, sensation RUE Sensation: decreased light touch RUE Coordination: decreased gross motor LUE Deficits / Details: reports "recovering" from last months CVA, mild decreased coordination LUE Sensation: WNL LUE Coordination: decreased gross motor   Lower Extremity Assessment Lower Extremity Assessment:  Defer to PT evaluation       Communication Communication Communication: No difficulties   Cognition Arousal/Alertness: Awake/alert Behavior During Therapy: WFL for tasks assessed/performed Overall Cognitive Status: Impaired/Different from baseline Area of Impairment: Safety/judgement,  Awareness, Problem solving                         Safety/Judgement: Decreased awareness of safety Awareness: Emergent Problem Solving: Slow processing, Requires verbal cues General Comments: pt with limited safety awareness     General Comments       Exercises     Shoulder Instructions      Home Living Family/patient expects to be discharged to:: Private residence Living Arrangements: Spouse/significant other Available Help at Discharge: Family Type of Home: Apartment Home Access: Level entry     Home Layout: One level     Bathroom Shower/Tub: Chief Strategy Officer: Handicapped height     Home Equipment: Grab bars - tub/shower;Tub bench;Cane - single point   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      Prior Functioning/Environment Prior Level of Function : Independent/Modified Independent                        OT Problem List: Decreased strength;Decreased activity tolerance;Impaired balance (sitting and/or standing);Decreased safety awareness;Decreased knowledge of use of DME or AE;Decreased knowledge of precautions;Impaired sensation;Decreased coordination      OT Treatment/Interventions: Self-care/ADL training;Therapeutic exercise;DME and/or AE instruction;Therapeutic activities;Patient/family education;Balance training;Neuromuscular education    OT Goals(Current goals can be found in the care plan section) Acute Rehab OT Goals Patient Stated Goal: home OT Goal Formulation: With patient Time For Goal Achievement: 07/16/21 Potential to Achieve Goals: Good  OT Frequency: Min 2X/week    Co-evaluation              AM-PAC OT "6 Clicks" Daily Activity     Outcome Measure Help from another person eating meals?: None Help from another person taking care of personal grooming?: A Little Help from another person toileting, which includes using toliet, bedpan, or urinal?: A Little Help from another person bathing  (including washing, rinsing, drying)?: A Little Help from another person to put on and taking off regular upper body clothing?: A Little Help from another person to put on and taking off regular lower body clothing?: A Little 6 Click Score: 19   End of Session Equipment Utilized During Treatment: Gait belt Nurse Communication: Mobility status  Activity Tolerance: Patient tolerated treatment well Patient left: in bed;with call bell/phone within reach  OT Visit Diagnosis: Other abnormalities of gait and mobility (R26.89);Muscle weakness (generalized) (M62.81)                Time: 1135-1150 OT Time Calculation (min): 15 min Charges:  OT General Charges $OT Visit: 1 Visit OT Evaluation $OT Eval Moderate Complexity: 1 Mod  Barry Brunner, OT Acute Rehabilitation Services Pager [phone] Office [phone]   Chancy Milroy 07/02/2021, 1:00 PM

## 2021-07-02 NOTE — Assessment & Plan Note (Signed)
No recent hemoglobin A1c available.  Home medication regimen included metformin 500 mg twice daily. -Follow-up hemoglobin A1c -Hold metformin -CBGs before every meal with sensitive SSI -Consider discontinuing sliding scale of insulin if blood sugars remain well controlled

## 2021-07-02 NOTE — Progress Notes (Addendum)
STROKE TEAM PROGRESS NOTE   INTERVAL HISTORY Patient is seen in his room with no family at the bedside.  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  He will undergo an angiogram today to further evaluate this stenosis.  Vitals:   07/02/21 0430 07/02/21 0551 07/02/21 1030 07/02/21 1115  BP: (!) 142/91 (!) 148/109 (!) 140/92 (!) 148/101  Pulse: 87 73 (!) 59 82  Resp: 16 18 18 19   Temp:  97.9 F (36.6 C)    TempSrc:  Oral    SpO2: 100% 100% 99% 97%  Weight:      Height:       CBC:  Recent Labs  Lab 07/01/21 1930  WBC 9.7  NEUTROABS 5.1  HGB 12.1*  HCT 39.3  MCV 81.7  PLT 299   Basic Metabolic Panel:  Recent Labs  Lab 07/01/21 1930  NA 135  K 3.9  CL 101  CO2 26  GLUCOSE 141*  BUN 11  CREATININE 0.71  CALCIUM 9.0   Lipid Panel:  Recent Labs  Lab 07/02/21 0831  CHOL 111  TRIG 50  HDL 39*  CHOLHDL 2.8  VLDL 10  LDLCALC 62   HgbA1c:  Recent Labs  Lab 07/02/21 0831  HGBA1C 6.4*   Urine Drug Screen:  Recent Labs  Lab 07/01/21 1959  LABOPIA NONE DETECTED  COCAINSCRNUR NONE DETECTED  LABBENZ NONE DETECTED  AMPHETMU NONE DETECTED  THCU NONE DETECTED  LABBARB NONE DETECTED    Alcohol Level  Recent Labs  Lab 07/01/21 1930  ETH <10    IMAGING past 24 hours CT ANGIO HEAD NECK W WO CM  Result Date: 07/01/2021 CLINICAL DATA:  Tingling sensation upon waking EXAM: CT ANGIOGRAPHY HEAD AND NECK TECHNIQUE: Multidetector CT imaging of the head and neck was performed using the standard protocol during bolus administration of intravenous contrast. Multiplanar CT image reconstructions and MIPs were obtained to evaluate the vascular anatomy. Carotid stenosis measurements (when applicable) are obtained utilizing NASCET criteria, using the distal internal carotid diameter  as the denominator. RADIATION DOSE REDUCTION: This exam was performed according to the departmental dose-optimization program which includes automated exposure control, adjustment of the mA and/or kV according to patient size and/or use of iterative reconstruction technique. CONTRAST:  07/03/2021 OMNIPAQUE IOHEXOL 350 MG/ML SOLN COMPARISON:  Head CT 07/01/2021 CTA head neck 06/01/2021 FINDINGS: CTA NECK FINDINGS SKELETON: There is no bony spinal canal stenosis. No lytic or blastic lesion. OTHER NECK: Normal pharynx, larynx and major salivary glands. No cervical lymphadenopathy. Unremarkable thyroid gland. UPPER CHEST: No pneumothorax or pleural effusion. No nodules or masses. AORTIC ARCH: There is no calcific atherosclerosis of the aortic arch. There is no aneurysm, dissection or hemodynamically significant stenosis of the visualized portion of the aorta. Normal variant aortic arch branching pattern with the brachiocephalic and left common carotid arteries sharing a common origin. The visualized proximal subclavian arteries are widely patent. RIGHT CAROTID SYSTEM: Normal without aneurysm, dissection or stenosis. LEFT CAROTID SYSTEM: Normal without aneurysm, dissection or stenosis. VERTEBRAL ARTERIES: Left dominant configuration. Both origins are clearly patent. There is no dissection, occlusion or flow-limiting stenosis to the skull base (V1-V3 segments). 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 Normal left V4. --Inferior cerebellar arteries: Normal. --Basilar artery: Unchanged occlusion  of the midportion of the basilar artery --Superior cerebellar arteries: Normal. --Posterior cerebral arteries (PCA): Normal. ANTERIOR CIRCULATION: --Intracranial internal carotid arteries: Normal. --Anterior cerebral arteries (ACA): Normal. Both A1 segments are present. Patent anterior communicating artery (a-comm). --Middle cerebral arteries (MCA): Normal. VENOUS  SINUSES: As permitted by contrast timing, patent. ANATOMIC VARIANTS: Fetal origins of both posterior cerebral arteries. Review of the MIP images confirms the above findings. IMPRESSION: 1. No new arterial occlusion. 2. 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 3. Fetal origins of both posterior cerebral arteries. Electronically Signed   By: Deatra Robinson M.D.   On: 07/01/2021 20:27   CT Head Wo Contrast  Result Date: 07/01/2021 CLINICAL DATA:  Neurological deficit, acute, stroke suspected. Right sided paresthesia in tingling. EXAM: CT HEAD WITHOUT CONTRAST TECHNIQUE: Contiguous axial images were obtained from the base of the skull through the vertex without intravenous contrast. RADIATION DOSE REDUCTION: This exam was performed according to the departmental dose-optimization program which includes automated exposure control, adjustment of the mA and/or kV according to patient size and/or use of iterative reconstruction technique. COMPARISON:  Studies from January of this year. FINDINGS: Brain: Subtle evidence of low-density in the right para median pons subsequent to the acute infarction of 1 month ago. No identifiable acute brainstem or cerebellar infarction. Cerebral hemispheres show mild chronic small-vessel ischemic change of white matter. No mass, hemorrhage, hydrocephalus or extra-axial collection. Vascular: There is atherosclerotic calcification of the major vessels at the base of the brain. Skull: Negative Sinuses/Orbits: Clear/normal Other: None IMPRESSION: No acute CT finding. Subtle low density within the right paramedian pons, sequela of acute infarction seen 1 month ago. Mild chronic small-vessel ischemic change of the cerebral hemispheric white matter. Electronically Signed   By: Paulina Fusi M.D.   On: 07/01/2021 19:22   MR BRAIN WO CONTRAST  Result Date: 07/02/2021 CLINICAL DATA:  Provided history: Neuro deficit, acute, stroke suspected.  Additional history provided: Neurological deficit, acute, stroke suspected. Right-sided paresthesias/tingling. EXAM: MRI HEAD WITHOUT CONTRAST TECHNIQUE: Multiplanar, multiecho pulse sequences of the brain and surrounding structures were obtained without intravenous contrast. COMPARISON:  Noncontrast head CT and CT angiogram head/neck 07/01/2021. Brain MRI 05/28/2021. FINDINGS: Brain: Cerebral volume is normal. Two acute infarcts within the left pons measuring up to 15 mm (for instance as seen on series 5, images 60 and 61). Known chronic infarct within the right pons. Mild to moderate multifocal T2 FLAIR hyperintense signal abnormality within the cerebral white matter, nonspecific but compatible chronic small vessel ischemic disease. No evidence of an intracranial mass. No chronic intracranial blood products. No extra-axial fluid collection. No midline shift. 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 Flow voids otherwise preserved within the proximal large arterial vessels. Skull and upper cervical spine: No focal suspicious marrow lesion. Sinuses/Orbits: Visualized orbits show no acute finding. Trace mucosal thickening within the bilateral ethmoid and sphenoid sinuses. IMPRESSION: Two acute infarcts within the left pons, measuring up to 15 mm. Known chronic infarct within the right pons. Mild-to-moderate chronic small vessel ischemic changes within the cerebral white matter, stable from the brain MRI of 05/28/2021. Electronically Signed   By: Jackey Loge D.O.   On: 07/02/2021 08:18    PHYSICAL EXAM General:  Alert, well-developed patient in no acute distress Respiratory: Regular, unlabored respirations on room air  NEURO:  Mental Status: AA&Ox3  Speech/Language: speech is without dysarthria or aphasia.  Naming, fluency, and comprehension intact.  Cranial Nerves:  II: PERRL. Visual fields full.  III, IV, VI: EOMI. Eyelids elevate symmetrically.  V:  Sensation is intact to light touch and symmetrical to face.  VII: Smile is symmetrical.   VIII: hearing intact to voice. IX, X: Phonation is normal.  XII: tongue is midline without fasciculations. Motor: no drift in all 3 ext.   Sensation- Intact to light touch bilaterally. Extinction absent to light touch to DSS.   Coordination: FTN intact bilaterally.No drift.  Gait- deferred   ASSESSMENT/PLAN Mr. Yee Joss is a 61 y.o. 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  He will undergo an angiogram today to further evaluate this stenosis.  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 Subtle low density in right paramedian pons Small vessel disease.  CTA head & neck occlusion of distal right V4 segment and midportion of basilar artery, PCAs of fetal origin MRI  two acute infarcts in left pons, chronic infarct in right pons, small vessel ischemic changes 2D Echo EF 60-65%, no intra-atrial shunt LDL 62 HgbA1c 6.4 VTE prophylaxis - lovenox    Diet   Diet NPO time specified   aspirin 81 mg daily and clopidogrel 75 mg daily prior to admission, now on aspirin 81 mg daily and clopidogrel 75 mg daily.  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 Patient has had a recent pontine stroke Plan for diagnostic angiogram today   Hypertension Home meds:  amlodipine-olmesartan 5-20 Stable Permissive hypertension (OK if < 220/120) but gradually normalize in 5-7 days Long-term BP goal normotensive  Hyperlipidemia Home meds:  atorvastatin 80 mg daily, resumed in hospital LDL 62, goal <  70 Continue statin at discharge  Diabetes type II Controlled Home meds:  metformin 500 mg BID HgbA1c 6.4, goal < 7.0 CBGs Recent Labs    07/01/21 1906 07/02/21 1125  GLUCAP 152* 121*    SSI  Other Stroke Risk Factors Obesity, Body mass index is 30.85 kg/m., BMI >/= 30 associated with increased stroke risk, recommend weight loss, diet and exercise as appropriate  Hx stroke  Other Active Problems none  Hospital day # 0  Cortney E Ernestina Columbia , MSN, AGACNP-BC [facility] See Amion for schedule and pager information 07/02/2021 12:42 PM  ATTENDING ATTESTATION:  Dr. Viviann Spare evaluated pt independently, reviewed imaging, chart, labs. Discussed and formulated plan with the APP. Please see APP note above for details.   Total 36 minutes spent on counseling patient and coordinating care, writing notes and reviewing chart.  Pt seen early this morning by Dr. Wilford Corner.  IR planning on angiogram for chronic basilar occlussion. Stroke workup pending.    Sidrah Harden,MD     To contact Stroke Continuity provider, please refer to [email or web address]. After hours, contact General Neurology

## 2021-07-02 NOTE — Assessment & Plan Note (Addendum)
Patient on testosterone gel. -Would like to recommend discontinuation of testosterone gel as one of the serious complications includes stroke, but would suggest neurology input.

## 2021-07-02 NOTE — Progress Notes (Signed)
Patient ID: Joshua Harrington, male   DOB: 1961-01-29, 61 y.o.   MRN: [id number] INR. 4 vessel cerebral arteriogram. RT rad approach. Findings. 1.Occluded basilar artery  just distal to AICA origins involving the Lt AICA partially. 2.Retrograde flow in distal basilar artery from RT PCA ,P 1 seg.  S.Illeana Edick MD

## 2021-07-02 NOTE — ED Notes (Signed)
Pt transferred to [facility] via [facility] 

## 2021-07-02 NOTE — Assessment & Plan Note (Signed)
BMI 30.85 kg/m. -Continue to counsel on need of dietary and lifestyle modifications

## 2021-07-02 NOTE — Progress Notes (Signed)
Plan of Care Note for accepted transfer   Patient: Joshua Harrington MRN: [id number]   DOA: 07/01/2021  Facility [facility] transfer[facility] Requesting Provider: Dr. Fredderick Phenix Reason for transfer: Right Sided Weakness Facility course:   61 year old male with past medical history of diabetes mellitus type 2, hyperlipidemia and recent diagnosis of acute right pontine stroke 05/28/2021 who presents to med [facility] emergency department with complaints of right-sided numbness.  Of note, during recent evaluation for previous stroke a CT angiogram of the head neck revealed a significant right basilar artery occlusion.  Patient explains that at approximately 12 PM on 2/26 he began to experience numbness and tingling of the right side of his body.  Symptoms resolved after several hours however at approximately 5 PM symptoms of right arm and right leg tingling and numbness recurred.  Patient denies any visual changes, headaches or focal weakness otherwise.  Patient reports that he is taking all of his medications.  Due to concerning neurologic symptoms patient presented to med [facility] emergency department for evaluation.  Upon evaluation in the emergency department ER provider discussed case with Dr. Jerrell Belfast with neurology who considering patient's identified right basilar artery occlusion felt it would be a good idea to hospitalize the patient for MRI brain.  Repeat CT angiogram of the head neck was performed in the emergency department revealing no large vessel change.  The hospitalist group was then called and patient was excepted for transfer to [facility] for continued medical care.    Plan of care: The patient is accepted for admission to Telemetry unit, at [facility]..    Author: Marinda Elk, MD 07/02/2021  Check [email or web address] for on-call coverage.  Nursing staff, Please call [facility] Admits & Consults System-Wide number on Amion as soon as patient's  arrival, so appropriate admitting provider can evaluate the pt.

## 2021-07-02 NOTE — Consult Note (Signed)
Neurology Consultation  Reason for Consult: Strokelike symptoms Referring Physician: Dr. Malvin Johns from [place]  CC: Right-sided tingling and numbness  History is obtained from: Patient, chart  HPI: Joshua Harrington is a 61 y.o. male past medical history of diabetes, hypertension, hyperlipidemia, who had a right pontine infarct May 28, 2021 with left-sided numbness and weakness symptoms which have nearly resolved.  He was seen in the hospital, MRI done, never admitted and left without being seen-follow-up with [facility] neurology-I do not have chart to review-started on Plavix in addition to aspirin, echo done and blood vessel imaging done which was consistent with significant vertebrobasilar stenoses/occlusions's-see details below. He came into [facility] on Sunday evening 07/01/2021 with symptoms of right-sided tingling and numbness that started when he woke up at around noon.  The symptoms went away momentarily and then returned back at around 5 PM.  He still has persistent right-sided tingling and numbness symptoms.  I received a call overnight from the provider at [facility].  Given his significant posterior circulation disease and now symptoms affecting the other side from the stroke from January 2023, I recommended a repeat CTA head and neck which is unchanged-see details below. I also recommend that he get an MRI and remainder of the stroke work-up if it has not been completed as well as a possible endovascular consultation for revascularization if that can be obtained. Denies any headache or visual changes. Numbness.  Denies fevers chills.   LKW: Sometime Saturday night when he went to bed-woke up Sunday, 07/01/2018 3:12 PM with the symptoms tpa given?: no, outside the window, recent stroke less than a month ago Premorbid modified Rankin scale (mRS): 0 ROS: Full ROS was performed and is negative except as noted in the HPI.   Past Medical History:   Diagnosis Date   Diabetes mellitus without complication (HCC)    Hernia    High cholesterol    Hypertension    Family History  Problem Relation Age of Onset   Cancer Mother        Stomach   Cirrhosis Father      Social History:   reports that he has never smoked. He has never used smokeless tobacco. He reports that he does not drink alcohol and does not use drugs.  Medications No current facility-administered medications for this encounter.  Current Outpatient Medications:    amLODipine-olmesartan (AZOR) 5-20 MG tablet, Take 1 tablet by mouth daily., Disp: , Rfl:    aspirin 325 MG tablet, Take 1 tablet by mouth daily., Disp: , Rfl:    atorvastatin (LIPITOR) 80 MG tablet, Take 1 tablet by mouth daily., Disp: , Rfl:    Cholecalciferol (VITAMIN D3) 125 MCG (5000 UT) CAPS, Take 1 capsule by mouth daily., Disp: , Rfl:    clopidogrel (PLAVIX) 75 MG tablet, Take 1 tablet (75 mg total) by mouth daily., Disp: 90 tablet, Rfl: 3   fish oil-omega-3 fatty acids 1000 MG capsule, Take 1 g by mouth daily. Take 4 daily, Disp: , Rfl:    ketoconazole (NIZORAL) 2 % cream, Apply 1 application topically daily. Apply to both feet and between toes once daily for 6 weeks, Disp: 30 g, Rfl: 1   metFORMIN (GLUCOPHAGE) 500 MG tablet, Take 500 mg by mouth 2 (two) times daily., Disp: , Rfl:    METFORMIN HCL PO, Take by mouth., Disp: , Rfl:    Testosterone 25 MG/2.5GM (1%) GEL, 1 pkt(s), Disp: , Rfl:  Turmeric 450 MG CAPS, Take by mouth., Disp: , Rfl:    Exam: Current vital signs: BP (!) 148/109 (BP Location: Left Arm)    Pulse 73    Temp 97.9 F (36.6 C) (Oral)    Resp 18    Ht 5\' 7"  (1.702 m)    Wt 89.4 kg    SpO2 100%    BMI 30.85 kg/m  Vital signs in last 24 hours: Temp:  [97.9 F (36.6 C)-98.2 F (36.8 C)] 97.9 F (36.6 C) (02/27 0551) Pulse Rate:  [69-88] 73 (02/27 0551) Resp:  [12-20] 18 (02/27 0551) BP: (135-157)/(67-109) 148/109 (02/27 0551) SpO2:  [97 %-100 %] 100 % (02/27 0551) Weight:   [89.4 kg] 89.4 kg (02/26 1858)  GENERAL: Awake, alert in NAD HEENT: - Normocephalic and atraumatic, dry mm, no LN++, no Thyromegally LUNGS - Clear to auscultation bilaterally with no wheezes CV - S1S2 RRR, no m/r/g, equal pulses bilaterally. ABDOMEN - Soft, nontender, nondistended with normoactive BS Ext: warm, well perfused, intact peripheral pulses, no edema  NEURO:  Mental Status: AA&Ox3  Language: speech is nondysarthric.  Naming, repetition, fluency, and comprehension intact. Cranial Nerves: PERRL EOMI, visual fields full, no facial asymmetry, facial sensation diminished on the right, hearing intact, tongue/uvula/soft palate midline, normal sternocleidomastoid and trapezius muscle strength. No evidence of tongue atrophy or fibrillations Motor: No drift in any of the 4 extremities Tone: is normal and bulk is normal Sensation-somewhat diminished on the right Coordination: FTN intact bilaterally, no ataxia in BLE. Gait- deferred  NIHSS-1 for sensation   Labs I have reviewed labs in epic and the results pertinent to this consultation are:  CBC    Component Value Date/Time   WBC 9.7 07/01/2021 1930   RBC 4.81 07/01/2021 1930   HGB 12.1 (L) 07/01/2021 1930   HCT 39.3 07/01/2021 1930   PLT 299 07/01/2021 1930   MCV 81.7 07/01/2021 1930   MCH 25.2 (L) 07/01/2021 1930   MCHC 30.8 07/01/2021 1930   RDW 15.1 07/01/2021 1930   LYMPHSABS 3.6 07/01/2021 1930   MONOABS 0.7 07/01/2021 1930   EOSABS 0.2 07/01/2021 1930   BASOSABS 0.0 07/01/2021 1930    CMP     Component Value Date/Time   NA 135 07/01/2021 1930   K 3.9 07/01/2021 1930   CL 101 07/01/2021 1930   CO2 26 07/01/2021 1930   GLUCOSE 141 (H) 07/01/2021 1930   BUN 11 07/01/2021 1930   CREATININE 0.71 07/01/2021 1930   CALCIUM 9.0 07/01/2021 1930   PROT 7.8 07/01/2021 1930   ALBUMIN 3.8 07/01/2021 1930   AST 19 07/01/2021 1930   ALT 18 07/01/2021 1930   ALKPHOS 82 07/01/2021 1930   BILITOT 0.4 07/01/2021 1930    GFRNONAA >60 07/01/2021 1930   2D echocardiogram 06/14/2021 He saw cardiology-[facility]-echocardiogram was completed with normal LV function, normal global wall motion, no shunting-partial echo report found in CV procedure section of the chart  Imaging I have reviewed the images obtained:  CT-head-no acute changes.  Subtle low-density within the right paramedian pons possibly sequela of the infarction seen 1 month ago.  Chronic small white matter disease  CTA head and neck: Distal right V4 segment and midportion of the basilar artery occlusion, unchanged from 1 month ago.  Fetal origins of both posterior cerebral arteries.  MRI examination of the brain 05/28/2021 IMPRESSION: 1. Acute right pontine infarct. 2. Mild-to-moderate chronic small vessel ischemic disease. 3. Abnormal appearance of the distal right vertebral artery which may reflect  slow flow or occlusion. Consider head and neck CTA or MRA for further evaluation.  Assessment:  61 year old who had a right pontine stroke 1 month ago and now presents with right-sided tingling and numbness concerning for a thalamic versus brainstem lacunar infarct. His work-up was done piecemeal as he did not stay up for hospitalization during the last stroke and I do not have access to his outpatient records-neurology at [facility] neurology. He has followed up with cardiology with echo done that has been unremarkable per the report-Dr. Patwardhan's note from 06/01/21. At this time, I feel he would benefit from a repeat MRI. He also might need discussion with interventional radiology for possible diagnostic cerebral angiogram to see if there is any revascularization possibility, if he continues to have posterior circulation strokes. Also to complete the stroke work-up I have made recommendations below  Impression: Recent pontine stroke on the right-now with right-sided symptoms concerning for new left thalamic versus brainstem stroke Disease  posterior circulation with right V4 and mid basilar occlusion on CTA  Recommendations: -Admit to hospitalist -Frequent neurochecks -Allow for permissive hypertension-do not treat blood pressures unless systolic is greater than [id number]. -Telemetry -PT OT speech therapy -Continue aspirin 81 and Plavix 75 that he has been taking for the past few weeks. -MRI brain without contrast -Atorvastatin 80 -Check A1c, lipid panel -No need to repeat TTE. -May need outpatient cardiac monitoring and consideration for TEE later on -I would recommend discussing this case with neuro IR to see if there is any role for a diagnostic cerebral angiogram in case the basilar needs to be revascularized after the MRI is done. Mitchell Heir obtain outpatient neurology records from [facility] neurology Plan relayed to Dr. Cyd Silence.  -- Amie Portland, MD Neurologist [facility] Pager: [phone]

## 2021-07-02 NOTE — Assessment & Plan Note (Signed)
Patient just recently found to have a acute right pontine stroke 1/23.  Work-up was done in outpatient setting.  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  Noted normal EF of 60 to 65% without signs of interatrial shunt or diastolic dysfunction on 2/9. -Continue statin, Plavix, aspirin

## 2021-07-02 NOTE — ED Notes (Signed)
Report given to [facility]. 

## 2021-07-02 NOTE — Assessment & Plan Note (Signed)
As noted above 

## 2021-07-02 NOTE — Assessment & Plan Note (Signed)
On admission blood pressures elevated up to 157/105.  Home blood pressure medication regimen includes amlodipine-olmesartan 5-20 mg daily. -Hold home blood pressure medication to allow for permissive hypertension up to systolic blood pressure 220 -Hydralazine IV as needed for systolic blood pressure greater than 220 or diastolic blood pressure greater than 110

## 2021-07-02 NOTE — Consult Note (Signed)
Chief Complaint: Patient was seen in consultation today for right sided numbness/tingling  Referring Physician(s): Milon Dikes, MD/Smith, Arn Medal, MD  Supervising Physician: Julieanne Cotton  Patient Status: [facility] ED  History of Present Illness: Joshua Harrington is a 61 y.o. 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 CT head showed no acute findings. Neurology was contacted and a repeat CTA head/neck was requested which showed:  1. No new arterial occlusion. 2. 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 3. Fetal origins of both posterior cerebral arteries.  He was transferred to [facility] for further work up and underwent MRI brain w/o contrast which showed:  Two acute infarcts within the left pons, measuring up to 15 mm.   Known chronic infarct within the right pons.   Mild-to-moderate chronic small vessel ischemic changes within the cerebral white matter, stable from the brain MRI of 05/28/2021.  NIR has been consulted for for diagnostic cerebral angiogram to further evaluate these imaging findings.   Patient seen in the ED, he reports that his right sided tingling/numbness has mostly resolved now and he feels well overall. He has been followed by [facility] (Dr. Ellan Lambert) since his previous stroke and was started on Plavix and ASA 81 which he takes daily. He recently retired from [employer]. He has DM and takes Metformin which he has not taken today. He had a breakfast tray delivered but hasn't eaten any of it yet. He is agreeable to cerebral angiogram today.   Past Medical History:  Diagnosis Date   Diabetes mellitus without complication (HCC)    Hernia    High cholesterol    Hypertension     Past Surgical History:  Procedure Laterality Date   ABDOMINAL SURGERY     HERNIA  REPAIR     VASECTOMY      Allergies: Patient has no known allergies.  Medications: Prior to Admission medications   Medication Sig Start Date End Date Taking? Authorizing Provider  amLODipine-olmesartan (AZOR) 5-20 MG tablet Take 1 tablet by mouth daily.  10/20/21 Yes [provider]  aspirin EC 81 MG tablet Take 81 mg by mouth daily. Swallow whole.   Yes [provider]  atorvastatin (LIPITOR) 80 MG tablet Take 1 tablet by mouth daily. 05/30/21  Yes [provider]  Cholecalciferol (VITAMIN D3) 125 MCG (5000 UT) CAPS Take 1 capsule by mouth daily.   Yes [provider]  clopidogrel (PLAVIX) 75 MG tablet Take 1 tablet (75 mg total) by mouth daily. 06/21/21  Yes Patwardhan, Manish J, MD  fish oil-omega-3 fatty acids 1000 MG capsule Take 1 g by mouth daily. Take 4 daily   Yes [provider]  metFORMIN (GLUCOPHAGE) 500 MG tablet Take 500 mg by mouth 2 (two) times daily. 02/18/20  Yes [provider]  Testosterone 25 MG/2.5GM (1%) GEL Apply 1 application topically daily.   Yes [provider]  Turmeric 450 MG CAPS Take 450 mg by mouth daily.   Yes [provider]  ketoconazole (NIZORAL) 2 % cream Apply 1 application topically daily. Apply to both feet and between toes once daily for 6 weeks Patient not taking: Reported on 07/02/2021 03/18/18   Freddie Breech, DPM     Family History  Problem Relation Age of Onset   Cancer Mother  Stomach   Cirrhosis Father    Alcohol abuse Father     Social History   Socioeconomic History   Marital status: Married    Spouse name: Not on file   Number of children: Not on file   Years of education: Not on file   Highest education level: Not on file  Occupational History   Not on file  Tobacco Use   Smoking status: Never   Smokeless tobacco: Never  Vaping Use   Vaping Use: Never used  Substance and Sexual Activity   Alcohol use: No   Drug use: No   Sexual  activity: Not on file  Other Topics Concern   Not on file  Social History Narrative   Not on file   Social Determinants of Health   Financial Resource Strain: Not on file  Food Insecurity: Not on file  Transportation Needs: Not on file  Physical Activity: Not on file  Stress: Not on file  Social Connections: Not on file     Review of Systems: A 12 point ROS discussed and pertinent positives are indicated in the HPI above.  All other systems are negative.  Review of Systems  Constitutional:  Negative for chills and fever.  Respiratory:  Negative for cough and shortness of breath.   Cardiovascular:  Negative for chest pain.  Gastrointestinal:  Negative for abdominal pain, nausea and vomiting.  Musculoskeletal:  Negative for back pain.  Neurological:  Positive for numbness (RUE/RLE). Negative for dizziness, tremors, seizures, syncope, facial asymmetry, speech difficulty, weakness, light-headedness and headaches.   Vital Signs: BP (!) 148/109 (BP Location: Left Arm)    Pulse 73    Temp 97.9 F (36.6 C) (Oral)    Resp 18    Ht 5\' 7"  (1.702 m)    Wt 197 lb (89.4 kg)    SpO2 100%    BMI 30.85 kg/m   Physical Exam Vitals and nursing note reviewed.  Constitutional:      General: He is not in acute distress. HENT:     Head: Normocephalic.     Mouth/Throat:     Mouth: Mucous membranes are moist.     Pharynx: Oropharynx is clear. No oropharyngeal exudate or posterior oropharyngeal erythema.  Cardiovascular:     Rate and Rhythm: Normal rate and regular rhythm.  Pulmonary:     Effort: Pulmonary effort is normal.     Breath sounds: Normal breath sounds.  Abdominal:     General: There is no distension.     Palpations: Abdomen is soft.     Tenderness: There is no abdominal tenderness.  Skin:    General: Skin is warm and dry.  Neurological:     Mental Status: He is alert.  Alert, awake, and oriented x 4 Speech and comprehension in tact PER bilaterally EOMs without nystagmus or  subjective diplopia. Visual fields grossly in tact No facial asymmetry. Tongue midline Motor power - moves all 4 extremities spontaneously   MD Evaluation Airway: WNL Heart: WNL Abdomen: WNL Chest/ Lungs: WNL ASA  Classification: 2 Mallampati/Airway Score: One   Imaging: CT ANGIO HEAD NECK W WO CM  Result Date: 07/01/2021 CLINICAL DATA:  Tingling sensation upon waking EXAM: CT ANGIOGRAPHY HEAD AND NECK TECHNIQUE: Multidetector CT imaging of the head and neck was performed using the standard protocol during bolus administration of intravenous contrast. Multiplanar CT image reconstructions and MIPs were obtained to evaluate the vascular anatomy. Carotid stenosis measurements (when applicable) are obtained utilizing NASCET criteria, using the  distal internal carotid diameter as the denominator. RADIATION DOSE REDUCTION: This exam was performed according to the departmental dose-optimization program which includes automated exposure control, adjustment of the mA and/or kV according to patient size and/or use of iterative reconstruction technique. CONTRAST:  100mL OMNIPAQUE IOHEXOL 350 MG/ML SOLN COMPARISON:  Head CT 07/01/2021 CTA head neck 06/01/2021 FINDINGS: CTA NECK FINDINGS SKELETON: There is no bony spinal canal stenosis. No lytic or blastic lesion. OTHER NECK: Normal pharynx, larynx and major salivary glands. No cervical lymphadenopathy. Unremarkable thyroid gland. UPPER CHEST: No pneumothorax or pleural effusion. No nodules or masses. AORTIC ARCH: There is no calcific atherosclerosis of the aortic arch. There is no aneurysm, dissection or hemodynamically significant stenosis of the visualized portion of the aorta. Normal variant aortic arch branching pattern with the brachiocephalic and left common carotid arteries sharing a common origin. The visualized proximal subclavian arteries are widely patent. RIGHT CAROTID SYSTEM: Normal without aneurysm, dissection or stenosis. LEFT CAROTID SYSTEM:  Normal without aneurysm, dissection or stenosis. VERTEBRAL ARTERIES: Left dominant configuration. Both origins are clearly patent. There is no dissection, occlusion or flow-limiting stenosis to the skull base (V1-V3 segments). 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 Normal left V4. --Inferior cerebellar arteries: Normal. 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 --Posterior cerebral arteries (PCA): Normal. ANTERIOR CIRCULATION: --Intracranial internal carotid arteries: Normal. --Anterior cerebral arteries (ACA): Normal. Both A1 segments are present. Patent anterior communicating artery (a-comm). --Middle cerebral arteries (MCA): Normal. VENOUS SINUSES: As permitted by contrast timing, patent. ANATOMIC VARIANTS: Fetal origins of both posterior cerebral arteries. Review of the MIP images confirms the above findings. IMPRESSION: 1. No new arterial occlusion. 2. 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 3. Fetal origins of both posterior cerebral arteries. Electronically Signed   By: Deatra RobinsonKevin  Herman M.D.   On: 07/01/2021 20:27   CT Head Wo Contrast  Result Date: 07/01/2021 CLINICAL DATA:  Neurological deficit, acute, stroke suspected. Right sided paresthesia in tingling. EXAM: CT HEAD WITHOUT CONTRAST TECHNIQUE: Contiguous axial images were obtained from the base of the skull through the vertex without intravenous contrast. RADIATION DOSE REDUCTION: This exam was performed according to the departmental dose-optimization program which includes automated exposure control, adjustment of the mA and/or kV according to patient size and/or use of iterative reconstruction technique. COMPARISON:  Studies from January of this year. FINDINGS: Brain: Subtle evidence of low-density in the right para median pons subsequent to the  acute infarction of 1 month ago. No identifiable acute brainstem or cerebellar infarction. Cerebral hemispheres show mild chronic small-vessel ischemic change of white matter. No mass, hemorrhage, hydrocephalus or extra-axial collection. Vascular: There is atherosclerotic calcification of the major vessels at the base of the brain. Skull: Negative Sinuses/Orbits: Clear/normal Other: None IMPRESSION: No acute CT finding. Subtle low density within the right paramedian pons, sequela of acute infarction seen 1 month ago. Mild chronic small-vessel ischemic change of the cerebral hemispheric white matter. Electronically Signed   By: Paulina FusiMark  Shogry M.D.   On: 07/01/2021 19:22   MR BRAIN WO CONTRAST  Result Date: 07/02/2021 CLINICAL DATA:  Provided history: Neuro deficit, acute, stroke suspected. Additional history provided: Neurological deficit, acute, stroke suspected. Right-sided paresthesias/tingling. EXAM: MRI HEAD WITHOUT CONTRAST TECHNIQUE: Multiplanar, multiecho pulse sequences of the brain and surrounding structures were obtained without intravenous contrast. COMPARISON:  Noncontrast head CT and CT angiogram head/neck 07/01/2021. Brain  MRI 05/28/2021. FINDINGS: Brain: Cerebral volume is normal. Two acute infarcts within the left pons measuring up to 15 mm (for instance as seen on series 5, images 60 and 61). Known chronic infarct within the right pons. Mild to moderate multifocal T2 FLAIR hyperintense signal abnormality within the cerebral white matter, nonspecific but compatible chronic small vessel ischemic disease. No evidence of an intracranial mass. No chronic intracranial blood products. No extra-axial fluid collection. No midline shift. 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 Flow voids otherwise preserved within the proximal large arterial vessels. Skull and upper cervical spine: No focal suspicious marrow lesion. Sinuses/Orbits: Visualized orbits show no  acute finding. Trace mucosal thickening within the bilateral ethmoid and sphenoid sinuses. IMPRESSION: Two acute infarcts within the left pons, measuring up to 15 mm. Known chronic infarct within the right pons. Mild-to-moderate chronic small vessel ischemic changes within the cerebral white matter, stable from the brain MRI of 05/28/2021. Electronically Signed   By: Jackey Loge D.O.   On: 07/02/2021 08:18   PCV ECHOCARDIOGRAM COMPLETE W BUBBLE  Result Date: 06/17/2021 Echocardiogram 06/14/2021: Normal LV systolic function with visual EF 60-65%. Left ventricle cavity is normal in size. Normal left ventricular wall thickness. Normal global wall motion. Normal diastolic filling pattern, normal LAP. No intra-atrial shunting, sonicated saline study is negative. No significant valvular heart disease. No prior study for comparison.   Labs:  CBC: Recent Labs    05/28/21 1149 07/01/21 1930  WBC 8.2 9.7  HGB 12.5* 12.1*  HCT 39.9 39.3  PLT 289 299    COAGS: Recent Labs    05/28/21 1149 07/01/21 1930  INR 1.0 1.0  APTT  --  28    BMP: Recent Labs    05/28/21 1149 07/01/21 1930  NA 137 135  K 3.8 3.9  CL 102 101  CO2 27 26  GLUCOSE 142* 141*  BUN 16 11  CALCIUM 9.2 9.0  CREATININE 0.79 0.71  GFRNONAA >60 >60    LIVER FUNCTION TESTS: Recent Labs    05/28/21 1149 07/01/21 1930  BILITOT 0.5 0.4  AST 16 19  ALT 13 18  ALKPHOS 71 82  PROT 7.6 7.8  ALBUMIN 3.7 3.8    TUMOR MARKERS: No results for input(s): AFPTM, CEA, CA199, CHROMGRNA in the last 8760 hours.  Assessment and Plan:  61 y/o M with history of HTN, HLD, DM and recent right pontine stroke (~1 month ago) who presented to [facility] 07/01/21 with right sided tingling/numbness - imaging showed no acute changes however there is concern for posterior circulation issues causing repeated strokes and NIR has been consulted for a diagnostic cerebral angiogram with moderate sedation to further evaluate  patient's vasculature.  Plan for diagnostic cerebral angiogram with moderate sedation today, pending any emergent procedures. Patient to remain NPO until post procedure, hold Metformin today and tomorrow but continue all other medications (including Plavix and ASA). Will need bed on floor capable of caring for arterial puncture post procedure (ED/IR RNs working on this).  Risks and benefits of diagnostic cerebral angiogram were discussed with the patient including, but not limited to bleeding, infection, vascular injury, stroke, or contrast induced renal failure.  This interventional procedure involves the use of X-rays and because of the nature of the planned procedure, it is possible that we will have prolonged use of X-ray fluoroscopy. Potential radiation risks to you include (but are not limited to) the following: - A slightly elevated risk  for cancer  several years later in life. This risk is typically less than 0.5% percent. This risk is low in comparison to the normal incidence of human cancer, which is 33% for women and 50% for men according to the American Cancer Society. - Radiation induced injury can include skin redness, resembling a rash, tissue breakdown / ulcers and hair loss (which can be temporary or permanent).   The likelihood of either of these occurring depends on the difficulty of the procedure and whether you are sensitive to radiation due to previous procedures, disease, or genetic conditions.  IF your procedure requires a prolonged use of radiation, you will be notified and given written instructions for further action.  It is your responsibility to monitor the irradiated area for the 2 weeks following the procedure and to notify your physician if you are concerned that you have suffered a radiation induced injury.    All of the patient's questions were answered, patient is agreeable to proceed.  Consent signed and in IR control room.  Thank you for this interesting  consult.  I greatly enjoyed meeting [address] and look forward to participating in their care.  A copy of this report was sent to the requesting provider on this date.  Electronically Signed: Villa Herb, PA-C 07/02/2021, 9:14 AM   I spent a total of 40 Minutes in face to face in clinical consultation, greater than 50% of which was counseling/coordinating care for diagnostic cerebral angiogram.

## 2021-07-02 NOTE — Sedation Documentation (Addendum)
Right radial sheath removed, TR band applied to right wrist with 11cc of air at 1623.

## 2021-07-02 NOTE — Evaluation (Signed)
Physical Therapy Evaluation Patient Details Name: Joshua Harrington MRN: [id number] DOB: 1960-07-09 Today's Date: 07/02/2021  History of Present Illness  Pt is a 61 y/o male presenting on 2/26 with R sided tingling and numbness. CT negative, MRI showed L pons infarct.  Noted R pontine CVA 1 month ago, but left hospital without workup. PMH inculdes: DM, HTN, hernia.  Clinical Impression  Pt admitted secondary to problem above with deficits below. Pt requiring min guard up to min A for steadying this session. Noted LOB X2-3 with DGI tasks. Educated about safety with mobility at home. Recommending outpatient therapies to address current deficits. Will continue to follow acutely.        Recommendations for follow up therapy are one component of a multi-disciplinary discharge planning process, led by the attending physician.  Recommendations may be updated based on patient status, additional functional criteria and insurance authorization.  Follow Up Recommendations Outpatient PT    Assistance Recommended at Discharge Intermittent Supervision/Assistance  Patient can return home with the following  A little help with bathing/dressing/bathroom;Help with stairs or ramp for entrance;Assist for transportation;Assistance with cooking/housework    Equipment Recommendations None recommended by PT  Recommendations for Other Services       Functional Status Assessment Patient has had a recent decline in their functional status and demonstrates the ability to make significant improvements in function in a reasonable and predictable amount of time.     Precautions / Restrictions Precautions Precautions: Fall Restrictions Weight Bearing Restrictions: No      Mobility  Bed Mobility Overal bed mobility: Modified Independent             General bed mobility comments: no assist required    Transfers Overall transfer level: Needs assistance Equipment used: None Transfers: Sit to/from Stand Sit to  Stand: Min guard           General transfer comment: Min guard for safety    Ambulation/Gait Ambulation/Gait assistance: Min guard, Min assist Gait Distance (Feet): 150 Feet Assistive device: None Gait Pattern/deviations: Step-through pattern, Staggering left, Staggering right Gait velocity: Decreased     General Gait Details: Pt with increased instability with DGI tasks, however, unaware of deficits. Requiring up to min A for steadying and had LOB X2-3.  Stairs            Wheelchair Mobility    Modified Rankin (Stroke Patients Only) Modified Rankin (Stroke Patients Only) Pre-Morbid Rankin Score: No significant disability Modified Rankin: Moderately severe disability     Balance Overall balance assessment: Needs assistance Sitting-balance support: No upper extremity supported, Feet supported Sitting balance-Leahy Scale: Good     Standing balance support: During functional activity, No upper extremity supported Standing balance-Leahy Scale: Fair                   Standardized Balance Assessment Standardized Balance Assessment : Dynamic Gait Index   Dynamic Gait Index Level Surface: Mild Impairment Change in Gait Speed: Mild Impairment Gait with Horizontal Head Turns: Moderate Impairment Gait with Vertical Head Turns: Severe Impairment Gait and Pivot Turn: Moderate Impairment Step Over Obstacle: Mild Impairment Step Around Obstacles: Moderate Impairment       Pertinent Vitals/Pain Pain Assessment Pain Assessment: No/denies pain    Home Living Family/patient expects to be discharged to:: Private residence Living Arrangements: Spouse/significant other Available Help at Discharge: Family Type of Home: Apartment Home Access: Level entry       Home Layout: One level Home Equipment: Grab bars - tub/shower;Tub bench;Cane -  single point 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    Prior Function Prior Level of Function :  Independent/Modified Independent                     Hand Dominance   Dominant Hand: Right    Extremity/Trunk Assessment   Upper Extremity Assessment Upper Extremity Assessment: Defer to OT evaluation RUE Deficits / Details: mild decreased coodination, sensation RUE Sensation: decreased light touch RUE Coordination: decreased gross motor LUE Deficits / Details: reports "recovering" from last months CVA, mild decreased coordination LUE Sensation: WNL LUE Coordination: decreased gross motor    Lower Extremity Assessment Lower Extremity Assessment: RLE deficits/detail;LLE deficits/detail RLE Deficits / Details: Decreased sensation. Strength grossly 5/5. RLE Coordination: decreased gross motor LLE Deficits / Details: Reports LLE is "recovering". Noted mild weakness in comparison to RLE. LLE Coordination: decreased gross motor    Cervical / Trunk Assessment Cervical / Trunk Assessment: Normal  Communication   Communication: No difficulties  Cognition Arousal/Alertness: Awake/alert Behavior During Therapy: WFL for tasks assessed/performed Overall Cognitive Status: Impaired/Different from baseline Area of Impairment: Safety/judgement, Awareness, Problem solving                         Safety/Judgement: Decreased awareness of safety Awareness: Emergent Problem Solving: Slow processing, Requires verbal cues General Comments: pt with limited safety awareness        General Comments      Exercises     Assessment/Plan    PT Assessment Patient needs continued PT services  PT Problem List Decreased strength;Decreased balance;Decreased activity tolerance;Decreased mobility;Decreased safety awareness;Decreased cognition       PT Treatment Interventions DME instruction;Functional mobility training;Gait training;Therapeutic exercise;Therapeutic activities;Balance training;Patient/family education;Neuromuscular re-education    PT Goals (Current goals can be  found in the Care Plan section)  Acute Rehab PT Goals Patient Stated Goal: to go home PT Goal Formulation: With patient Time For Goal Achievement: 07/16/21 Potential to Achieve Goals: Fair    Frequency Min 4X/week     Co-evaluation               AM-PAC PT "6 Clicks" Mobility  Outcome Measure Help needed turning from your back to your side while in a flat bed without using bedrails?: A Little Help needed moving from lying on your back to sitting on the side of a flat bed without using bedrails?: A Little Help needed moving to and from a bed to a chair (including a wheelchair)?: A Little Help needed standing up from a chair using your arms (e.g., wheelchair or bedside chair)?: A Little Help needed to walk in hospital room?: A Little Help needed climbing 3-5 steps with a railing? : A Lot 6 Click Score: 17    End of Session Equipment Utilized During Treatment: Gait belt Activity Tolerance: Patient tolerated treatment well Patient left: in bed;with call bell/phone within reach (on stretcher in ED) Nurse Communication: Mobility status PT Visit Diagnosis: Unsteadiness on feet (R26.81)    Time: 1135-1150 PT Time Calculation (min) (ACUTE ONLY): 15 min   Charges:   PT Evaluation $PT Eval Low Complexity: 1 Low          Cindee Salt, DPT  Acute Rehabilitation Services  Pager: [phone] Office: [phone]   Lehman Prom 07/02/2021, 2:01 PM

## 2021-07-02 NOTE — Assessment & Plan Note (Addendum)
Patient presented with complaints of right-sided numbness which started yesterday.  Initial CT scan of the brain did not note any acute abnormality.  He was out of the window for tPA.  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  Echocardiogram from 2/9 noted normal EF of 60-65% without signs of interatrial shunt or systolic dysfunction.  Risk factors include hypertension, hyperlipidemia, diabetes mellitus type 2, and transdermal testosterone. 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 -PT/OT/Speech to evaluate and treat -ASA, Plavix, and high-dose statin -Follow-up telemetry.  Patient is followed by [facility] cardiology may need to be set up with Holter monitor and/or transesophageal echocardiogram -Social work consult -Appreciate neurology consultative services, will follow-up any additional recommendations -Dr. Corliss Skains of neurointerventional radiology consulted,will follow-up for any further recommendation

## 2021-07-02 NOTE — Assessment & Plan Note (Signed)
Home medication regimen included atorvastatin 80 mg daily. -Follow-up lipid panel -Continue high-dose statin

## 2021-07-02 NOTE — ED Notes (Signed)
Pt is currently in MRI

## 2021-07-02 NOTE — ED Notes (Signed)
Report given to Alycia Rossetti, Charity fundraiser, charge nurse at [facility] ED

## 2021-07-03 DIAGNOSIS — I6322 Cerebral infarction due to unspecified occlusion or stenosis of basilar arteries: Secondary | ICD-10-CM

## 2021-07-03 LAB — GLUCOSE, CAPILLARY
Glucose-Capillary: 110 mg/dL — ABNORMAL HIGH (ref 70–99)
Glucose-Capillary: 114 mg/dL — ABNORMAL HIGH (ref 70–99)

## 2021-07-03 MED ORDER — TICAGRELOR 90 MG PO TABS
90.0000 mg | ORAL_TABLET | Freq: Two times a day (BID) | ORAL | Status: DC
  Administered 2021-07-03: 90 mg via ORAL
  Filled 2021-07-03: qty 1

## 2021-07-03 MED ORDER — TICAGRELOR 90 MG PO TABS: 90.0000 mg | ORAL_TABLET | Freq: Two times a day (BID) | ORAL | 1 refills | Status: DC

## 2021-07-03 NOTE — Progress Notes (Signed)
Physical Therapy Treatment Patient Details Name: Joshua Harrington MRN: [id number] DOB: April 16, 1961 Today's Date: 07/03/2021   History of Present Illness Pt is a 61 y/o male presenting on 2/26 with R sided tingling and numbness. CT negative, MRI showed L pons infarct.  Noted R pontine CVA 1 month ago, but left hospital without workup. PMH inculdes: DM, HTN, hernia.    PT Comments    Patient progressing mobility but continues to require cues for safety due to decreased awareness of deficits. Session focused on higher level balance and functional strength challenges. Pt completed SLS challenge with stair taps and tandem gait and required min-mod assist to prevent LOB posteriorly and laterally as pt had tendency to reach outside BOS rather than use stepping strategies. He will benefit from follow up rehab at OPPT setting and assist/supervision from family for safety. Acute PT will progress pt as able during stay.    Recommendations for follow up therapy are one component of a multi-disciplinary discharge planning process, led by the attending physician.  Recommendations may be updated based on patient status, additional functional criteria and insurance authorization.  Follow Up Recommendations  Outpatient PT     Assistance Recommended at Discharge Intermittent Supervision/Assistance  Patient can return home with the following A little help with bathing/dressing/bathroom;Help with stairs or ramp for entrance;Assist for transportation;Assistance with cooking/housework   Equipment Recommendations  None recommended by PT    Recommendations for Other Services       Precautions / Restrictions Precautions Precautions: Fall Restrictions Weight Bearing Restrictions: No     Mobility  Bed Mobility               General bed mobility comments: pt OOB in recliner    Transfers Overall transfer level: Needs assistance Equipment used: None Transfers: Sit to/from Stand Sit to Stand: Min guard,  Supervision           General transfer comment: supervision/guarding for safety    Ambulation/Gait Ambulation/Gait assistance: Min guard, Min assist Gait Distance (Feet): 300 Feet Assistive device: None Gait Pattern/deviations: Step-through pattern, Staggering left, Staggering right, Decreased stride length Gait velocity: Decreased     General Gait Details: pt continues to have slight decrease in awareness of deficits. min guard with intermittent assist needed to steady gait as pt drifts Lt>Rt. VSS throughout. pt required cues for safety to incresaed awareness of IV pole.   Stairs             Wheelchair Mobility    Modified Rankin (Stroke Patients Only)       Balance Overall balance assessment: Needs assistance Sitting-balance support: No upper extremity supported, Feet supported Sitting balance-Leahy Scale: Good     Standing balance support: During functional activity, No upper extremity supported Standing balance-Leahy Scale: Good                              Cognition Arousal/Alertness: Awake/alert Behavior During Therapy: WFL for tasks assessed/performed Overall Cognitive Status: Impaired/Different from baseline Area of Impairment: Safety/judgement, Awareness, Problem solving                         Safety/Judgement: Decreased awareness of safety Awareness: Emergent Problem Solving: Difficulty sequencing, Requires verbal cues          Exercises Other Exercises Other Exercises: 10x sit<>stand from recliner. no UE use Other Exercises: 2x10 reps bil LE step taps with bil UE support and then no  UE support, min assist to prevent posterior LOB intermittently and cues to pause and regain balance Other Exercises: 1x10 reps lateral step up on 6" stait with bil UE support Other Exercises: 20' ambulation tandem gait, min-mod assist to prevent LOB    General Comments        Pertinent Vitals/Pain      Home Living     Available  Help at Discharge: Family Type of Home: Apartment                  Prior Function            PT Goals (current goals can now be found in the care plan section) Acute Rehab PT Goals Patient Stated Goal: to go home PT Goal Formulation: With patient Time For Goal Achievement: 07/16/21 Potential to Achieve Goals: Fair Progress towards PT goals: Progressing toward goals    Frequency    Min 4X/week      PT Plan Current plan remains appropriate    Co-evaluation              AM-PAC PT "6 Clicks" Mobility   Outcome Measure  Help needed turning from your back to your side while in a flat bed without using bedrails?: None Help needed moving from lying on your back to sitting on the side of a flat bed without using bedrails?: None Help needed moving to and from a bed to a chair (including a wheelchair)?: A Little Help needed standing up from a chair using your arms (e.g., wheelchair or bedside chair)?: A Little Help needed to walk in hospital room?: A Little Help needed climbing 3-5 steps with a railing? : A Little 6 Click Score: 20    End of Session Equipment Utilized During Treatment: Gait belt Activity Tolerance: Patient tolerated treatment well Patient left: in chair;with call bell/phone within reach;with chair alarm set Nurse Communication: Mobility status PT Visit Diagnosis: Unsteadiness on feet (R26.81)     Time: 4503-8882 PT Time Calculation (min) (ACUTE ONLY): 21 min  Charges:  $Neuromuscular Re-education: 8-22 mins                     Wynn Maudlin, DPT Acute Rehabilitation Services Office [phone] Pager [phone]    Anitra Lauth 07/03/2021, 12:52 PM

## 2021-07-03 NOTE — TOC Transition Note (Addendum)
Transition of Care [facility]) - CM/SW Discharge Note   Patient Details  Name: Joshua Harrington MRN: [id number] Date of Birth: 1960-06-25  Transition of Care [facility]) CM/SW Contact:  Kermit Balo, RN Phone Number: 07/03/2021, 10:55 AM   Clinical Narrative:    Patient is discharging home with outpatient therapy arranged through [facility]. Orders in Epic and information on the AVS. No new DME needs.  Pt usually does the driving for the home as his wife is disabled from a CVA. He denies any issues with home medications. His wife has an aide daily from 10a-5pm that can also provide supervision for him if needed.  Pt states daughter will provide transport home today. 1337:CM provided the patient 30 day free and $5 co pay cards for Brilinta  Final next level of care: OP Rehab Barriers to Discharge: No Barriers Identified   Patient Goals and CMS Choice     Choice offered to / list presented to : Patient  Discharge Placement                       Discharge Plan and Services                                     Social Determinants of Health (SDOH) Interventions     Readmission Risk Interventions No flowsheet data found.

## 2021-07-03 NOTE — Progress Notes (Addendum)
STROKE TEAM PROGRESS NOTE   INTERVAL HISTORY Patient is seen in his room with no family at the bedside.  He has been hemodynamically stable overnight with no acute events.  His neurological exam remains stable.  He is eager to be discharged.  Vitals:   07/03/21 0205 07/03/21 0355 07/03/21 0752 07/03/21 1157  BP: 124/70 111/64 (!) 146/97 (!) 142/93  Pulse: 67 66 75 83  Resp: 16 16 17 19   Temp: 97.7 F (36.5 C) 97.7 F (36.5 C) 98.2 F (36.8 C) 98.7 F (37.1 C)  TempSrc: Oral Oral Oral Oral  SpO2: 100% 100% 100% 100%  Weight:      Height:       CBC:  Recent Labs  Lab 07/01/21 1930  WBC 9.7  NEUTROABS 5.1  HGB 12.1*  HCT 39.3  MCV 81.7  PLT 299    Basic Metabolic Panel:  Recent Labs  Lab 07/01/21 1930  NA 135  K 3.9  CL 101  CO2 26  GLUCOSE 141*  BUN 11  CREATININE 0.71  CALCIUM 9.0    Lipid Panel:  Recent Labs  Lab 07/02/21 0831  CHOL 111  TRIG 50  HDL 39*  CHOLHDL 2.8  VLDL 10  LDLCALC 62    HgbA1c:  Recent Labs  Lab 07/02/21 0831  HGBA1C 6.4*    Urine Drug Screen:  Recent Labs  Lab 07/01/21 1959  LABOPIA NONE DETECTED  COCAINSCRNUR NONE DETECTED  LABBENZ NONE DETECTED  AMPHETMU NONE DETECTED  THCU NONE DETECTED  LABBARB NONE DETECTED     Alcohol Level  Recent Labs  Lab 07/01/21 1930  ETH <10     IMAGING past 24 hours No results found.  PHYSICAL EXAM General:  Alert, well-developed patient in no acute distress Respiratory: Regular, unlabored respirations on room air  NEURO:  Mental Status: AA&Ox3  Speech/Language: speech is without dysarthria or aphasia.  Naming, fluency, and comprehension intact.  Cranial Nerves:  II: PERRL. Visual fields full.  III, IV, VI: EOMI. Eyelids elevate symmetrically.  V: Sensation is intact to light touch and symmetrical to face.  VII: Smile is symmetrical.   VIII: hearing intact to voice. IX, X: Phonation is normal.  XII: tongue is midline with fasciculations. Motor: 5/5 strength in  all extremities  Sensation- Intact to light touch bilaterally. Extinction absent to light touch to DSS.   Coordination: FTN intact bilaterally.No drift.  Gait- deferred   ASSESSMENT/PLAN [name] is a 61 y.o. 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  He has undergone an angiogram today to further evaluate this stenosis.  Stenosis is felt to be chronic, and no acute interventions are advised.  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 Subtle low density in right paramedian pons Small vessel disease.  CTA head & neck occlusion of distal right V4 segment and midportion of basilar artery, PCAs of fetal origin MRI  two acute infarcts in left pons, chronic infarct in right pons, small vessel ischemic changes 2D Echo EF 60-65%, no intra-atrial shunt LDL 62 HgbA1c 6.4 VTE prophylaxis - lovenox    Diet   Diet Carb Modified Fluid consistency: Thin; Room service appropriate? Yes  aspirin 81 mg daily and clopidogrel 75 mg daily prior to admission, now on aspirin 81 mg daily and Brilinta (ticagrelor) 90 mg bid.  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 seen on CTA Patient has had a recent pontine stroke Diagnostic angiogram performed No interventions recommended at this time   Hypertension Home meds:  amlodipine-olmesartan 5-20 Stable Permissive hypertension (OK if < 220/120) but gradually normalize in 5-7 days Long-term BP goal normotensive  Hyperlipidemia Home meds:  atorvastatin 80 mg daily, resumed in hospital LDL 62, goal < 70 Continue statin at  discharge  Diabetes type II Controlled Home meds:  metformin 500 mg BID HgbA1c 6.4, goal < 7.0 CBGs Recent Labs    07/02/21 2138 07/03/21 0614 07/03/21 1202  GLUCAP 153* 110* 114*     SSI  Other Stroke Risk Factors Obesity, Body mass index is 30.85 kg/m., BMI >/= 30 associated with increased stroke risk, recommend weight loss, diet and exercise as appropriate  Hx stroke  Other Active Problems none  Hospital day # 1  Joshua Harrington , MSN, AGACNP-BC [facility] See Amion for schedule and pager information 07/03/2021 12:53 PM  ATTENDING ATTESTATION:  Discussed with IR Joshua Harrington. No intervention as risk outweight benefits. Exam with only right sided numbness.  Start aspirin and brilinta due to plavix failure. F/U with Joshua Harrington outpt for repeat CTA in 3 months and consider options at that point.  D/C home today.  Neurolgy will sign off.   Joshua Harrington evaluated pt independently, reviewed imaging, chart, labs. Discussed and formulated plan with the APP. Please see APP note above for details.   Total 36 minutes spent on counseling patient and coordinating care, writing notes and reviewing chart.  Joshua Andalon,MD     To contact Stroke Continuity provider, please refer to [email or web address]. After hours, contact General Neurology

## 2021-07-03 NOTE — Progress Notes (Signed)
Referring Physician(s): Dr. Wilford Corner  Supervising Physician: Julieanne Cotton  Patient Status:  [facility] - In-pt  Chief Complaint: Left pontine strokes s/p diagnostic cerebral angiogram with Dr. Corliss Skains 07/02/21  Subjective: Patient sitting up in bed having just finished eating his breakfast. He still feels tingling sensations to the right side of his body but otherwise feels great. He would like to be discharged home soon.   Allergies: Patient has no known allergies.  Medications: Prior to Admission medications   Medication Sig Start Date End Date Taking? Authorizing Provider  amLODipine-olmesartan (AZOR) 5-20 MG tablet Take 1 tablet by mouth daily.  10/20/21 Yes [provider]  aspirin EC 81 MG tablet Take 81 mg by mouth daily. Swallow whole.   Yes [provider]  atorvastatin (LIPITOR) 80 MG tablet Take 1 tablet by mouth daily. 05/30/21  Yes [provider]  Cholecalciferol (VITAMIN D3) 125 MCG (5000 UT) CAPS Take 1 capsule by mouth daily.   Yes [provider]  clopidogrel (PLAVIX) 75 MG tablet Take 1 tablet (75 mg total) by mouth daily. 06/21/21  Yes Patwardhan, Manish J, MD  fish oil-omega-3 fatty acids 1000 MG capsule Take 1 g by mouth daily. Take 4 daily   Yes [provider]  metFORMIN (GLUCOPHAGE) 500 MG tablet Take 500 mg by mouth 2 (two) times daily. 02/18/20  Yes [provider]  Testosterone 25 MG/2.5GM (1%) GEL Apply 1 application topically daily.   Yes [provider]  Turmeric 450 MG CAPS Take 450 mg by mouth daily.   Yes [provider]  ketoconazole (NIZORAL) 2 % cream Apply 1 application topically daily. Apply to both feet and between toes once daily for 6 weeks Patient not taking: Reported on 07/02/2021 03/18/18   Freddie Breech, DPM  ticagrelor (BRILINTA) 90 MG TABS tablet Take 1 tablet (90 mg total) by mouth 2 (two) times daily. 07/03/21   de Saintclair Halsted, Cortney E, NP     Vital Signs: BP  (!) 142/93 (BP Location: Left Arm)    Pulse 83    Temp 98.7 F (37.1 C) (Oral)    Resp 19    Ht 5\' 7"  (1.702 m)    Wt 197 lb (89.4 kg)    SpO2 100%    BMI 30.85 kg/m   Physical Exam Constitutional:      General: He is not in acute distress.    Appearance: He is not ill-appearing.  HENT:     Mouth/Throat:     Mouth: Mucous membranes are moist.     Pharynx: Oropharynx is clear.  Cardiovascular:     Comments: Right radial artery vascular site is clean, soft, dry and non-tender.  Pulmonary:     Effort: Pulmonary effort is normal.  Neurological:     Mental Status: He is alert and oriented to person, place, and time.     Cranial Nerves: No dysarthria or facial asymmetry.     Motor: No weakness.    Imaging: CT ANGIO HEAD NECK W WO CM  Result Date: 07/01/2021 CLINICAL DATA:  Tingling sensation upon waking EXAM: CT ANGIOGRAPHY HEAD AND NECK TECHNIQUE: Multidetector CT imaging of the head and neck was performed using the standard protocol during bolus administration of intravenous contrast. Multiplanar CT image reconstructions and MIPs were obtained to evaluate the vascular anatomy. Carotid stenosis measurements (when applicable) are obtained utilizing NASCET criteria, using the distal internal carotid diameter as the denominator. RADIATION DOSE REDUCTION: This exam was performed according to the  departmental dose-optimization program which includes automated exposure control, adjustment of the mA and/or kV according to patient size and/or use of iterative reconstruction technique. CONTRAST:  OMNIPAQUE IOHEXOL 350 MG/ML SOLN COMPARISON:  Head CT 07/01/2021 CTA head neck 06/01/2021 FINDINGS: CTA NECK FINDINGS SKELETON: There is no bony spinal canal stenosis. No lytic or blastic lesion. OTHER NECK: Normal pharynx, larynx and major salivary glands. No cervical lymphadenopathy. Unremarkable thyroid gland. UPPER CHEST: No pneumothorax or pleural effusion. No nodules or masses. AORTIC ARCH: There is  no calcific atherosclerosis of the aortic arch. There is no aneurysm, dissection or hemodynamically significant stenosis of the visualized portion of the aorta. Normal variant aortic arch branching pattern with the brachiocephalic and left common carotid arteries sharing a common origin. The visualized proximal subclavian arteries are widely patent. RIGHT CAROTID SYSTEM: Normal without aneurysm, dissection or stenosis. LEFT CAROTID SYSTEM: Normal without aneurysm, dissection or stenosis. VERTEBRAL ARTERIES: Left dominant configuration. Both origins are clearly patent. There is no dissection, occlusion or flow-limiting stenosis to the skull base (V1-V3 segments). 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 Normal left V4. --Inferior cerebellar arteries: Normal. 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 --Posterior cerebral arteries (PCA): Normal. ANTERIOR CIRCULATION: --Intracranial internal carotid arteries: Normal. --Anterior cerebral arteries (ACA): Normal. Both A1 segments are present. Patent anterior communicating artery (a-comm). --Middle cerebral arteries (MCA): Normal. VENOUS SINUSES: As permitted by contrast timing, patent. ANATOMIC VARIANTS: Fetal origins of both posterior cerebral arteries. Review of the MIP images confirms the above findings. IMPRESSION: 1. No new arterial occlusion. 2. 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 3. Fetal origins of both posterior cerebral arteries. Electronically Signed   By: Deatra Robinson M.D.   On: 07/01/2021 20:27   CT Head Wo Contrast  Result Date: 07/01/2021 CLINICAL DATA:  Neurological deficit, acute, stroke suspected. Right sided paresthesia in tingling. EXAM: CT HEAD WITHOUT CONTRAST TECHNIQUE: Contiguous axial images were obtained from the base of the skull through  the vertex without intravenous contrast. RADIATION DOSE REDUCTION: This exam was performed according to the departmental dose-optimization program which includes automated exposure control, adjustment of the mA and/or kV according to patient size and/or use of iterative reconstruction technique. COMPARISON:  Studies from January of this year. FINDINGS: Brain: Subtle evidence of low-density in the right para median pons subsequent to the acute infarction of 1 month ago. No identifiable acute brainstem or cerebellar infarction. Cerebral hemispheres show mild chronic small-vessel ischemic change of white matter. No mass, hemorrhage, hydrocephalus or extra-axial collection. Vascular: There is atherosclerotic calcification of the major vessels at the base of the brain. Skull: Negative Sinuses/Orbits: Clear/normal Other: None IMPRESSION: No acute CT finding. Subtle low density within the right paramedian pons, sequela of acute infarction seen 1 month ago. Mild chronic small-vessel ischemic change of the cerebral hemispheric white matter. Electronically Signed   By: Paulina Fusi M.D.   On: 07/01/2021 19:22   MR BRAIN WO CONTRAST  Result Date: 07/02/2021 CLINICAL DATA:  Provided history: Neuro deficit, acute, stroke suspected. Additional history provided: Neurological deficit, acute, stroke suspected. Right-sided paresthesias/tingling. EXAM: MRI HEAD WITHOUT CONTRAST TECHNIQUE: Multiplanar, multiecho pulse sequences of the brain and surrounding structures were obtained without intravenous contrast. COMPARISON:  Noncontrast head CT and CT angiogram head/neck 07/01/2021. Brain MRI 05/28/2021. FINDINGS: Brain: Cerebral volume is normal. Two acute infarcts within the left pons measuring up  to 15 mm (for instance as seen on series 5, images 60 and 61). Known chronic infarct within the right pons. Mild to moderate multifocal T2 FLAIR hyperintense signal abnormality within the cerebral white matter, nonspecific but compatible  chronic small vessel ischemic disease. No evidence of an intracranial mass. No chronic intracranial blood products. No extra-axial fluid collection. No midline shift. 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 Flow voids otherwise preserved within the proximal large arterial vessels. Skull and upper cervical spine: No focal suspicious marrow lesion. Sinuses/Orbits: Visualized orbits show no acute finding. Trace mucosal thickening within the bilateral ethmoid and sphenoid sinuses. IMPRESSION: Two acute infarcts within the left pons, measuring up to 15 mm. Known chronic infarct within the right pons. Mild-to-moderate chronic small vessel ischemic changes within the cerebral white matter, stable from the brain MRI of 05/28/2021. Electronically Signed   By: Jackey Loge D.O.   On: 07/02/2021 08:18    Labs:  CBC: Recent Labs    05/28/21 1149 07/01/21 1930  WBC 8.2 9.7  HGB 12.5* 12.1*  HCT 39.9 39.3  PLT 289 299    COAGS: Recent Labs    05/28/21 1149 07/01/21 1930  INR 1.0 1.0  APTT  --  28    BMP: Recent Labs    05/28/21 1149 07/01/21 1930  NA 137 135  K 3.8 3.9  CL 102 101  CO2 27 26  GLUCOSE 142* 141*  BUN 16 11  CALCIUM 9.2 9.0  CREATININE 0.79 0.71  GFRNONAA >60 >60    LIVER FUNCTION TESTS: Recent Labs    05/28/21 1149 07/01/21 1930  BILITOT 0.5 0.4  AST 16 19  ALT 13 18  ALKPHOS 71 82  PROT 7.6 7.8  ALBUMIN 3.7 3.8    Assessment and Plan:  Left pontine strokes s/p diagnostic cerebral angiogram with Dr. Corliss Skains 07/02/21  Cerebral angiogram showed occluded basilar artery. Given that the only neurological symptom the patient has is right sided paresthesias IR recommends medical management at this time with aspirin and Brilinta. Plan for follow up CTA in three months.  The patient knows to follow up with Neurology as an outpatient and to present to the ED if he develops any new/acute onset neurological symptoms like  weakness, trouble speaking, vision difficulties, etc.   Other plans per primary teams.   Electronically Signed: Alwyn Ren, AGACNP-BC [phone] 07/03/2021, 2:35 PM   I spent a total of 15 Minutes at the the patient's bedside AND on the patient's hospital floor or unit, greater than 50% of which was counseling/coordinating care for cerebral angiogram follow up.

## 2021-07-03 NOTE — Progress Notes (Signed)
Discharge instructions given. Patient verbalized understanding and all questions were answered.  ?

## 2021-07-03 NOTE — Progress Notes (Signed)
Occupational Therapy Treatment Patient Details Name: Joshua Harrington MRN: [id number] DOB: 1960-12-22 Today's Date: 07/03/2021   History of present illness Pt is a 61 y/o male presenting on 2/26 with R sided tingling and numbness. CT negative, MRI showed L pons infarct.  Noted R pontine CVA 1 month ago, but left hospital without workup. PMH inculdes: DM, HTN, hernia.   OT comments  Joshua Harrington is progressing well with plans to d/c home today. He demonstrated great ability to complete BADLs with set up - supervision A fro safety, but benefits from cues for safety. Pt continues to present with impaired FM coordination and cognition. Reviewed [facility] FAST stroke education, pt verbalized understanding. D/c recommendation remains appropriate.    Recommendations for follow up therapy are one component of a multi-disciplinary discharge planning process, led by the attending physician.  Recommendations may be updated based on patient status, additional functional criteria and insurance authorization.    Follow Up Recommendations  Outpatient OT    Assistance Recommended at Discharge Intermittent Supervision/Assistance  Patient can return home with the following  A little help with walking and/or transfers;A little help with bathing/dressing/bathroom;Assistance with cooking/housework;Assist for transportation   Equipment Recommendations  None recommended by OT       Precautions / Restrictions Precautions Precautions: Fall Restrictions Weight Bearing Restrictions: No       Mobility Bed Mobility Overal bed mobility: Modified Independent                  Transfers Overall transfer level: Needs assistance Equipment used: None Transfers: Sit to/from Stand Sit to Stand: Supervision                 Balance Overall balance assessment: Needs assistance Sitting-balance support: No upper extremity supported, Feet supported       Standing balance support: During functional activity Standing  balance-Leahy Scale: Good                             ADL either performed or assessed with clinical judgement   ADL Overall ADL's : Needs assistance/impaired                     Lower Body Dressing: Set up;Sit to/from stand   Toilet Transfer: Supervision/safety;Ambulation           Functional mobility during ADLs: Supervision/safety;Cueing for safety General ADL Comments: ADL session for d/c. Pt demosntrated great ability to complete BADLs with supervision only. cues for safety/problem solving    Extremity/Trunk Assessment Upper Extremity Assessment RUE Deficits / Details: mild decreased coodination, sensation RUE Sensation: decreased light touch RUE Coordination: decreased gross motor LUE Deficits / Details: reports "recovering" from last months CVA, mild decreased coordination LUE Sensation: WNL LUE Coordination: decreased gross motor   Lower Extremity Assessment Lower Extremity Assessment: Defer to PT evaluation        Vision   Vision Assessment?: No apparent visual deficits   Perception Perception Perception: Not tested   Praxis Praxis Praxis: Not tested    Cognition Arousal/Alertness: Awake/alert Behavior During Therapy: WFL for tasks assessed/performed Overall Cognitive Status: Impaired/Different from baseline Area of Impairment: Safety/judgement, Awareness, Problem solving       Safety/Judgement: Decreased awareness of safety Awareness: Emergent Problem Solving: Difficulty sequencing, Requires verbal cues General Comments: pt with limited safety awareness              General Comments VSS on RA, reviewed BE FAST education  Pertinent Vitals/ Pain       Pain Assessment Pain Assessment: No/denies pain  Home Living     Available Help at Discharge: Family Type of Home: Apartment                Lives With: Spouse        Frequency  Min 2X/week        Progress Toward Goals  OT Goals(current goals can now  be found in the care plan section)  Progress towards OT goals: Progressing toward goals  Acute Rehab OT Goals Patient Stated Goal: home OT Goal Formulation: With patient Time For Goal Achievement: 07/16/21 Potential to Achieve Goals: Good ADL Goals Pt Will Perform Grooming: Independently Pt Will Perform Lower Body Dressing: Independently Pt Will Transfer to Toilet: Independently Pt Will Perform Toileting - Clothing Manipulation and hygiene: Independently Pt Will Perform Tub/Shower Transfer: Tub transfer;ambulating;tub bench;with modified independence Pt/caregiver will Perform Home Exercise Program: Both right and left upper extremity  Plan Discharge plan remains appropriate       AM-PAC OT "6 Clicks" Daily Activity     Outcome Measure   Help from another person eating meals?: None Help from another person taking care of personal grooming?: A Little Help from another person toileting, which includes using toliet, bedpan, or urinal?: A Little Help from another person bathing (including washing, rinsing, drying)?: A Little Help from another person to put on and taking off regular upper body clothing?: A Little Help from another person to put on and taking off regular lower body clothing?: A Little 6 Click Score: 19    End of Session    OT Visit Diagnosis: Other abnormalities of gait and mobility (R26.89);Muscle weakness (generalized) (M62.81)   Activity Tolerance Patient tolerated treatment well   Patient Left in chair;with call bell/phone within reach;with chair alarm set   Nurse Communication Mobility status        Time: 1355-1415 OT Time Calculation (min): 20 min  Charges: OT General Charges $OT Visit: 1 Visit OT Treatments $Self Care/Home Management : 8-22 mins    Joshua Harrington A Icey Tello 07/03/2021, 2:41 PM

## 2021-07-03 NOTE — Evaluation (Addendum)
Speech Language Pathology Evaluation Patient Details Name: Joshua Harrington MRN: [id number] DOB: October 16, 1960 Today's Date: 07/03/2021 Time: 0076-2263 SLP Time Calculation (min) (ACUTE ONLY): 23 min  Problem List:  Patient Active Problem List   Diagnosis Date Noted   History of CVA (cerebrovascular accident) 07/02/2021   Vertebral artery disease (HCC) 07/02/2021   Obesity (BMI 30-39.9) 07/02/2021   Right sided numbness 07/02/2021   Right-sided numbness secondary to CVA (cerebral vascular accident) (HCC) 07/01/2021   Essential hypertension 06/01/2021   Type 2 diabetes mellitus with complication, without long-term current use of insulin (HCC) 06/01/2021   Right pontine stroke (HCC) 05/31/2021   Hyperglycemia due to type 2 diabetes mellitus (HCC) 06/13/2020   Male hypogonadism 06/13/2020   Mixed hyperlipidemia 06/13/2020   Sciatica 06/13/2020   Vitamin D deficiency 06/13/2020   Environmental allergies 03/18/2018   Recurrent incisional hernia with incarceration 08/05/2012   Past Medical History:  Past Medical History:  Diagnosis Date   Diabetes mellitus without complication (HCC)    Hernia    High cholesterol    Hypertension    Past Surgical History:  Past Surgical History:  Procedure Laterality Date   ABDOMINAL SURGERY     HERNIA REPAIR     VASECTOMY     HPI:  pt is a 61 yo male adm to [facility] with right sided tingling - Pt found to have 2 acute strokes in left pons and prior right pons CVA 05/2021. 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  He has h/o HTN, DM.  He manages home duties- is the caregiver for his wife - as she has h/o CVA and left sided paresis. Caregiver comes for his wife from 10-5.   Speech evaluation ordered.   Assessment / Plan / Recommendation Clinical Impression  SLUMS administered with pt scoring 18/30 - areas of strengths include orientation, expressive language skills.  Difficulties noted in working  memory, clock drawing, recall of narrative 2/4 questions answered correctly.   In addition, pt with lingual fasciculations upon lingual protrusion - also appeared with decreased lingual strength to the left.  Pt's speech is intelligible - but he does report imprecise articulation.  SLP recommends pt have follow up SLP as an OP to assure he is able to manage his home duties given he manages all finances, medications, cooking, cleaning, appointments etc as his wife is disabled.  Pt agreeable to plan.    SLP Assessment  SLP Recommendation/Assessment: Patient needs continued Speech Lanaguage Pathology Services SLP Visit Diagnosis: Cognitive communication deficit (R41.841)    Recommendations for follow up therapy are one component of a multi-disciplinary discharge planning process, led by the attending physician.  Recommendations may be updated based on patient status, additional functional criteria and insurance authorization.    Follow Up Recommendations  Outpatient SLP    Assistance Recommended at Discharge  None  Functional Status Assessment Patient has had a recent decline in their functional status and demonstrates the ability to make significant improvements in function in a reasonable and predictable amount of time.  Frequency and Duration min 1 x/week  1 week      SLP Evaluation Cognition  Overall Cognitive Status: Impaired/Different from baseline Arousal/Alertness: Awake/alert Orientation Level: Oriented X4 Year: 2023 Month: February Day of Week: Correct Memory: Appears intact (pt recalled 3/5 words independently; 2/5 recalled with multiple choice) Awareness: Appears intact Problem Solving: Appears intact Safety/Judgment: Appears intact       Comprehension  Auditory Comprehension Overall Auditory Comprehension:  Appears within functional limits for tasks assessed Yes/No Questions: Not tested Commands: Within Functional Limits Conversation: Complex Visual  Recognition/Discrimination Discrimination: Not tested Reading Comprehension Reading Status: Not tested    Expression Expression Primary Mode of Expression: Verbal Verbal Expression Overall Verbal Expression: Appears within functional limits for tasks assessed Initiation: No impairment Repetition: No impairment Naming: Not tested Pragmatics: No impairment Written Expression Dominant Hand: Right Written Expression: Exceptions to [facility] (pt had difficulty with clock drawing, ? visuoperception)   Oral / Motor  Oral Motor/Sensory Function Overall Oral Motor/Sensory Function: Mild impairment Lingual ROM: Reduced left Lingual Symmetry: Within Functional Limits Lingual Strength: Suspected CN XII (hypoglossal) dysfunction;Other (Comment) (pt with lingual fasciculations  - bilateral) Motor Speech Overall Motor Speech: Appears within functional limits for tasks assessed Respiration: Within functional limits Phonation: Normal Resonance: Within functional limits Articulation: Within functional limitis Intelligibility: Intelligible Motor Planning: Not tested   Rolena Infante, MS [facility] SLP Acute Rehab Services Office [phone] Pager [phone]          Chales Abrahams 07/03/2021, 12:26 PM

## 2021-07-04 NOTE — Discharge Summary (Signed)
Physician Discharge Summary   Patient: Joshua Harrington MRN: [id number] DOB: July 08, 1960  Admit date:     07/01/2021  Discharge date: 07/03/2021  Discharge Physician: Kathlen Mody   PCP: [name], MD   Recommendations at discharge:  Please follow upw ith PCP in one week.  Please follow up with neurology as recommended.  Please follow u with cbc and BMP in one week.   Discharge Diagnoses: Principal Problem:   Right-sided numbness secondary to CVA (cerebral vascular accident) [facility]) Active Problems:   Male hypogonadism   Mixed hyperlipidemia   Essential hypertension   Type 2 diabetes mellitus with complication, without long-term current use of insulin (HCC)   History of CVA (cerebrovascular accident)   Vertebral artery disease (HCC)   Obesity (BMI 30-39.9)   Right sided numbness     Hospital Course:  Joshua Harrington is a 61 y.o. right-handed male with medical history significant of hypertension, hyperlipidemia, CVA, diabetes mellitus type 2 with complaints of tingling of the right side of his face, arm, and leg.   Assessment and Plan: * Right-sided numbness secondary to CVA (cerebral vascular accident) (HCC)- (present on admission) Patient presented with complaints of right-sided numbness which started yesterday.  Initial CT scan of the brain did not note any acute abnormality.  He was out of the window for tPA.  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    Risk factors include hypertension, hyperlipidemia, diabetes mellitus type 2, and transdermal testosterone. -Follow-up MRI of the brain resulted showing 2 acute infarcts within the left pons measuring up to 15 mm -Check Hemoglobin A1c(6.4) and lipid panel showed LDL is 62.  Echocardiogram showed preserved LVEF and no intra artrial shunt.  -PT/OT/Speech recommended outpatient PT/OT -PT started on aspirin and plavix , transitioned to aspirin and  Brillinta for discharge.  -Follow-up telemetry.  Patient is followed by [facility] cardiology may need to be set up  -Social work consult -Appreciate neurology consultative services, will follow-up any additional recommendations -Dr. Corliss Skains of neurointerventional radiology consulted, underwent cerebral angiogram ,    Obesity (BMI 30-39.9)- (present on admission) BMI 30.85 kg/m. -Continue to counsel on need of dietary and lifestyle modifications  Vertebral artery disease (HCC) As noted above.  History of CVA (cerebrovascular accident) Patient just recently found to have a acute right pontine stroke 1/23.  Work-up was done in outpatient setting.  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  Noted normal EF of 60 to 65% without signs of interatrial shunt or diastolic dysfunction on 2/9. Pt was on aspirin and plavix , transitioned to aspirin and Brillinta.   Type 2 diabetes mellitus with complication, without long-term current use of insulin (HCC)- (present on admission) No recent hemoglobin A1c available.  Home medication regimen included metformin 500 mg twice daily. - continue with HOME MEDS on discharge.  -  Essential hypertension- (present on admission) On admission blood pressures elevated up to 157/105.  Home blood pressure medication regimen includes amlodipine-olmesartan 5-20 mg daily. -resume home meds on discharge.   Mixed hyperlipidemia- (present on admission) Home medication regimen included atorvastatin 80 mg daily. -Continue high-dose statin  Male hypogonadism- (present on admission) Patient on testosterone gel.      Consultants: neurology  IR Procedures performed: NONE.   Disposition: Home Diet recommendation:  Discharge Diet Orders (From admission, onward)     Start  Ordered   07/03/21 0000  Diet - low sodium heart healthy        07/03/21 1322           Cardiac and  Carb modified diet  DISCHARGE MEDICATION: Allergies as of 07/03/2021   No Known Allergies      Medication List     STOP taking these medications    clopidogrel 75 MG tablet Commonly known as: PLAVIX       TAKE these medications    amLODipine-olmesartan 5-20 MG tablet Commonly known as: AZOR Take 1 tablet by mouth daily.   aspirin EC 81 MG tablet Take 81 mg by mouth daily. Swallow whole.   atorvastatin 80 MG tablet Commonly known as: LIPITOR Take 1 tablet by mouth daily.   fish oil-omega-3 fatty acids 1000 MG capsule Take 1 g by mouth daily. Take 4 daily   ketoconazole 2 % cream Commonly known as: NIZORAL Apply 1 application topically daily. Apply to both feet and between toes once daily for 6 weeks   metFORMIN 500 MG tablet Commonly known as: GLUCOPHAGE Take 500 mg by mouth 2 (two) times daily.   Testosterone 25 MG/2.5GM (1%) Gel Apply 1 application topically daily.   ticagrelor 90 MG Tabs tablet Commonly known as: BRILINTA Take 1 tablet (90 mg total) by mouth 2 (two) times daily.   Turmeric 450 MG Caps Take 450 mg by mouth daily.   Vitamin D3 125 MCG (5000 UT) Caps Take 1 capsule by mouth daily.        Follow-up Information     [facility] Schedule an appointment as soon as possible for a visit in 1 week(s).   Specialty: Rehabilitation Contact information: [address] [id number] [facility] [place] [id number] [phone]                Discharge Exam: Ceasar MonsFiled Weights   07/01/21 1858  Weight: 89.4 kg   General exam: Appears calm and comfortable  Respiratory system: Clear to auscultation. Respiratory effort normal. Cardiovascular system: S1 & S2 heard, RRR. No JVD, murmurs, rubs, gallops or clicks. No pedal edema. Gastrointestinal system: Abdomen is nondistended, soft and nontender. No organomegaly or masses felt. Normal bowel sounds heard. Central nervous system: Alert and  oriented. No focal neurological deficits. Extremities: Symmetric 5 x 5 power. Skin: No rashes, lesions or ulcers Psychiatry: Judgement and insight appear normal. Mood & affect appropriate.    Condition at discharge: fair  The results of significant diagnostics from this hospitalization (including imaging, microbiology, ancillary and laboratory) are listed below for reference.   Imaging Studies: CT ANGIO HEAD NECK W WO CM  Result Date: 07/01/2021 CLINICAL DATA:  Tingling sensation upon waking EXAM: CT ANGIOGRAPHY HEAD AND NECK TECHNIQUE: Multidetector CT imaging of the head and neck was performed using the standard protocol during bolus administration of intravenous contrast. Multiplanar CT image reconstructions and MIPs were obtained to evaluate the vascular anatomy. Carotid stenosis measurements (when applicable) are obtained utilizing NASCET criteria, using the distal internal carotid diameter as the denominator. RADIATION DOSE REDUCTION: This exam was performed according to the departmental dose-optimization program which includes automated exposure control, adjustment of the mA and/or kV according to patient size and/or use of iterative reconstruction technique. CONTRAST:  100mL OMNIPAQUE IOHEXOL 350 MG/ML SOLN COMPARISON:  Head CT 07/01/2021 CTA head neck 06/01/2021 FINDINGS: CTA NECK FINDINGS SKELETON: There is no bony spinal canal stenosis. No lytic or blastic lesion. OTHER NECK: Normal pharynx, larynx and major salivary  glands. No cervical lymphadenopathy. Unremarkable thyroid gland. UPPER CHEST: No pneumothorax or pleural effusion. No nodules or masses. AORTIC ARCH: There is no calcific atherosclerosis of the aortic arch. There is no aneurysm, dissection or hemodynamically significant stenosis of the visualized portion of the aorta. Normal variant aortic arch branching pattern with the brachiocephalic and left common carotid arteries sharing a common origin. The visualized proximal subclavian  arteries are widely patent. RIGHT CAROTID SYSTEM: Normal without aneurysm, dissection or stenosis. LEFT CAROTID SYSTEM: Normal without aneurysm, dissection or stenosis. VERTEBRAL ARTERIES: Left dominant configuration. Both origins are clearly patent. There is no dissection, occlusion or flow-limiting stenosis to the skull base (V1-V3 segments). 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 Normal left V4. --Inferior cerebellar arteries: Normal. 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 --Posterior cerebral arteries (PCA): Normal. ANTERIOR CIRCULATION: --Intracranial internal carotid arteries: Normal. --Anterior cerebral arteries (ACA): Normal. Both A1 segments are present. Patent anterior communicating artery (a-comm). --Middle cerebral arteries (MCA): Normal. VENOUS SINUSES: As permitted by contrast timing, patent. ANATOMIC VARIANTS: Fetal origins of both posterior cerebral arteries. Review of the MIP images confirms the above findings. IMPRESSION: 1. No new arterial occlusion. 2. 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 3. Fetal origins of both posterior cerebral arteries. Electronically Signed   By: Deatra Robinson M.D.   On: 07/01/2021 20:27   CT Head Wo Contrast  Result Date: 07/01/2021 CLINICAL DATA:  Neurological deficit, acute, stroke suspected. Right sided paresthesia in tingling. EXAM: CT HEAD WITHOUT CONTRAST TECHNIQUE: Contiguous axial images were obtained from the base of the skull through the vertex without intravenous contrast. RADIATION DOSE REDUCTION: This exam was performed according to the departmental dose-optimization program which includes automated exposure control, adjustment of the mA and/or kV according to patient size and/or use of iterative reconstruction technique. COMPARISON:  Studies from  January of this year. FINDINGS: Brain: Subtle evidence of low-density in the right para median pons subsequent to the acute infarction of 1 month ago. No identifiable acute brainstem or cerebellar infarction. Cerebral hemispheres show mild chronic small-vessel ischemic change of white matter. No mass, hemorrhage, hydrocephalus or extra-axial collection. Vascular: There is atherosclerotic calcification of the major vessels at the base of the brain. Skull: Negative Sinuses/Orbits: Clear/normal Other: None IMPRESSION: No acute CT finding. Subtle low density within the right paramedian pons, sequela of acute infarction seen 1 month ago. Mild chronic small-vessel ischemic change of the cerebral hemispheric white matter. Electronically Signed   By: Paulina Fusi M.D.   On: 07/01/2021 19:22   MR BRAIN WO CONTRAST  Result Date: 07/02/2021 CLINICAL DATA:  Provided history: Neuro deficit, acute, stroke suspected. Additional history provided: Neurological deficit, acute, stroke suspected. Right-sided paresthesias/tingling. EXAM: MRI HEAD WITHOUT CONTRAST TECHNIQUE: Multiplanar, multiecho pulse sequences of the brain and surrounding structures were obtained without intravenous contrast. COMPARISON:  Noncontrast head CT and CT angiogram head/neck 07/01/2021. Brain MRI 05/28/2021. FINDINGS: Brain: Cerebral volume is normal. Two acute infarcts within the left pons measuring up to 15 mm (for instance as seen on series 5, images 60 and 61). Known chronic infarct within the right pons. Mild to moderate multifocal T2 FLAIR hyperintense signal abnormality within the cerebral white matter, nonspecific but compatible chronic small vessel ischemic disease. No evidence of an intracranial mass. No chronic intracranial blood products. No extra-axial fluid collection. No midline shift. Vascular: Known occlusion of the  V4 right vertebral artery and proximal basilar artery. Flow voids otherwise preserved within the proximal large arterial  vessels. Skull and upper cervical spine: No focal suspicious marrow lesion. Sinuses/Orbits: Visualized orbits show no acute finding. Trace mucosal thickening within the bilateral ethmoid and sphenoid sinuses. IMPRESSION: Two acute infarcts within the left pons, measuring up to 15 mm. Known chronic infarct within the right pons. Mild-to-moderate chronic small vessel ischemic changes within the cerebral white matter, stable from the brain MRI of 05/28/2021. Electronically Signed   By: Jackey Loge D.O.   On: 07/02/2021 08:18   PCV ECHOCARDIOGRAM COMPLETE W BUBBLE  Result Date: 06/17/2021 Echocardiogram 06/14/2021: Normal LV systolic function with visual EF 60-65%. Left ventricle cavity is normal in size. Normal left ventricular wall thickness. Normal global wall motion. Normal diastolic filling pattern, normal LAP. No intra-atrial shunting, sonicated saline study is negative. No significant valvular heart disease. No prior study for comparison.   Microbiology: Results for orders placed or performed during the hospital encounter of 07/01/21  Resp Panel by RT-PCR (Flu A&B, Covid) Nasopharyngeal Swab     Status: None   Collection Time: 07/01/21  7:30 PM   Specimen: Nasopharyngeal Swab; Nasopharyngeal(NP) swabs in vial transport medium  Result Value Ref Range Status   SARS Coronavirus 2 by RT PCR NEGATIVE NEGATIVE Final    Comment: (NOTE) SARS-CoV-2 target nucleic acids are NOT DETECTED.  The SARS-CoV-2 RNA is generally detectable in upper respiratory specimens during the acute phase of infection. The lowest concentration of SARS-CoV-2 viral copies this assay can detect is 138 copies/mL. A negative result does not preclude SARS-Cov-2 infection and should not be used as the sole basis for treatment or other patient management decisions. A negative result may occur with  improper specimen collection/handling, submission of specimen other than nasopharyngeal swab, presence of viral mutation(s) within  the areas targeted by this assay, and inadequate number of viral copies(<138 copies/mL). A negative result must be combined with clinical observations, patient history, and epidemiological information. The expected result is Negative.  Fact Sheet for Patients:  BloggerCourse.com  Fact Sheet for Healthcare Providers:  SeriousBroker.it  This test is no t yet approved or cleared by the [place] FDA and  has been authorized for detection and/or diagnosis of SARS-CoV-2 by FDA under an Emergency Use Authorization (EUA). This EUA will remain  in effect (meaning this test can be used) for the duration of the COVID-19 declaration under Section 564(b)(1) of the Act, 21 U.S.C.section 360bbb-3(b)(1), unless the authorization is terminated  or revoked sooner.       Influenza A by PCR NEGATIVE NEGATIVE Final   Influenza B by PCR NEGATIVE NEGATIVE Final    Comment: (NOTE) The Xpert Xpress SARS-CoV-2/FLU/RSV plus assay is intended as an aid in the diagnosis of influenza from Nasopharyngeal swab specimens and should not be used as a sole basis for treatment. Nasal washings and aspirates are unacceptable for Xpert Xpress SARS-CoV-2/FLU/RSV testing.  Fact Sheet for Patients: BloggerCourse.com  Fact Sheet for Healthcare Providers: SeriousBroker.it  This test is not yet approved or cleared by the [place] FDA and has been authorized for detection and/or diagnosis of SARS-CoV-2 by FDA under an Emergency Use Authorization (EUA). This EUA will remain in effect (meaning this test can be used) for the duration of the COVID-19 declaration under Section 564(b)(1) of the Act, 21 U.S.C. section 360bbb-3(b)(1), unless the authorization is terminated or revoked.  Performed at [facility], [address], [place], [place] [address]  Labs: CBC: Recent Labs  Lab 07/01/21 1930   WBC 9.7  NEUTROABS 5.1  HGB 12.1*  HCT 39.3  MCV 81.7  PLT 299   Basic Metabolic Panel: Recent Labs  Lab 07/01/21 1930  NA 135  K 3.9  CL 101  CO2 26  GLUCOSE 141*  BUN 11  CREATININE 0.71  CALCIUM 9.0   Liver Function Tests: Recent Labs  Lab 07/01/21 1930  AST 19  ALT 18  ALKPHOS 82  BILITOT 0.4  PROT 7.8  ALBUMIN 3.8   CBG: Recent Labs  Lab 07/02/21 1125 07/02/21 1723 07/02/21 2138 07/03/21 0614 07/03/21 1202  GLUCAP 121* 97 153* 110* 114*    Discharge time spent: 42 MINUTES.  Signed: Kathlen ModyVijaya Quantay Zaremba, MD [facility] 07/04/2021

## 2021-07-06 ENCOUNTER — Other Ambulatory Visit: Payer: Self-pay | Admitting: *Deleted

## 2021-07-06 DIAGNOSIS — I639 Cerebral infarction, unspecified: Secondary | ICD-10-CM

## 2021-07-06 NOTE — Patient Outreach (Signed)
[facility]) Care Management ? ?07/06/2021 ? ?Joshua Harrington ?04-20-1961 ?[id number] ? ? ?RED ON EMMI ALERT - Stroke ?Day # 1 ?Date: 3/2 ?Red Alert Reason: Feeling worse overall?  YES ?New problems?  YES ? ? ?Outreach attempt #1, successful to member.  Identity verified.  This care manager introduced self and stated purpose of call.  [facility] care management services explained.   ? ?Member report he is doing "pretty good."  He does have residual effects of stroke, left side weakness, unstable gait at times and slow speech.  These are the reasons for the above noted red alerts.  State these aren't new issues since discharge, but problems he has continued to have over the past few days.  He will have outpatient rehab evaluation on Monday, 3/6.  He also has scheduled follow up appointment for neurology on 3/27.  He is aware that he will need follow up with PCP, will call to schedule.  Unable to drive at this time, agrees to care guide referral for transportation resources.   ? ?He state he has been the caregiver of his wife for the past several years as she is disabled post stroke and in wheelchair.  She has caregiver in the home M-F for several hours a day but he is responsible for her otherwise.  He has not been able to prepare dinner for them in the evenings nor has he been able to prepare meals on weekends.  He has daughter that lives in the area but she works full time with limited ability to assist.  Agrees to speak with care guide for food resources as well.   ? ?He has been monitoring his blood pressure and blood sugar, both reported stable (range 130's/80's and 110's).  Reviewed medication changes, report taking as instructed.  Denies any urgent concerns, encouraged to contact this care manager with questions.  ? ?Plan: ?RN CM will send stroke prevention education and follow up within the next 2 weeks.  Will also place referral to care guide for community resources. ? ?Kemper Durie, RN, MSN, CCM ?[facility] Care  Management  ?Community Care Manager ?[phone] ? ?

## 2021-07-06 NOTE — Patient Outreach (Signed)
Received a red flag Emmi stroke notification for Mr. Madariaga. ?I have assigned Valente David, RN to call for follow up and determine if there are any Case Management needs.  ?  ?Arville Care, CBCS, CMAA ?White Management Assistant ?[facility] Management ?[phone]  ?

## 2021-07-06 NOTE — Patient Instructions (Signed)
Visit Information ? ?Thank you for taking time to visit with me today. Please don't hesitate to contact me if I can be of assistance to you before our next scheduled telephone appointment. ? ?Following are the goals we discussed today:  ?Continue monitoring blood sugar and blood pressure daily. ?Call PCP to schedule follow up appointment. ? ?Our next appointment is by telephone in 2 weeks. ? ?The patient verbalized understanding of instructions, educational materials, and care plan provided today and agreed to receive a mailed copy of patient instructions, educational materials, and care plan.  ? ?The patient has been provided with contact information for the care management team and has been advised to call with any health related questions or concerns.  ? ?Kemper Durie, RN, MSN, CCM ?[facility] Care Management  ?Community Care Manager ?[phone] ? ? ? ? ?

## 2021-07-09 ENCOUNTER — Ambulatory Visit: Payer: Commercial Managed Care - HMO | Attending: Internal Medicine | Admitting: Physical Therapy

## 2021-07-09 ENCOUNTER — Ambulatory Visit: Payer: Commercial Managed Care - HMO | Admitting: Occupational Therapy

## 2021-07-09 ENCOUNTER — Telehealth: Payer: Self-pay | Admitting: *Deleted

## 2021-07-09 ENCOUNTER — Encounter: Payer: Self-pay | Admitting: Physical Therapy

## 2021-07-09 ENCOUNTER — Other Ambulatory Visit: Payer: Self-pay

## 2021-07-09 VITALS — BP 138/78 | HR 97

## 2021-07-09 DIAGNOSIS — R200 Anesthesia of skin: Secondary | ICD-10-CM | POA: Diagnosis not present

## 2021-07-09 DIAGNOSIS — M6281 Muscle weakness (generalized): Secondary | ICD-10-CM

## 2021-07-09 DIAGNOSIS — R208 Other disturbances of skin sensation: Secondary | ICD-10-CM | POA: Insufficient documentation

## 2021-07-09 DIAGNOSIS — R2681 Unsteadiness on feet: Secondary | ICD-10-CM | POA: Diagnosis present

## 2021-07-09 DIAGNOSIS — R278 Other lack of coordination: Secondary | ICD-10-CM | POA: Insufficient documentation

## 2021-07-09 DIAGNOSIS — R2689 Other abnormalities of gait and mobility: Secondary | ICD-10-CM | POA: Insufficient documentation

## 2021-07-09 NOTE — Telephone Encounter (Signed)
? ?  Telephone encounter was:  Successful.  ?07/09/2021 ?Name: Joshua Harrington MRN: [id number] DOB: 1961-04-28 ? ?Joshua Harrington is a 62 y.o. year old male who is a primary care patient of Osei-Bonsu, Greggory Stallion, MD . The community resource team was consulted for assistance with Transportation Needs  ? ?Care guide performed the following interventions: Patient provided with information about care guide support team and interviewed to confirm resource needs. ? ?Needs to book [facility] transportation, sent request for ongoing rehab transport  ?Follow Up Plan:  Care guide will follow up with patient by phone over the next days ? ?Joshua Lopezperez Greenauer -[name] ?Care Guide , Embedded Care Coordination ?[place], Care Management  ?[phone] ?[address] [place] , [place] [address] ?Email : [email or web address] ?  ?

## 2021-07-09 NOTE — Therapy (Signed)
OUTPATIENT PHYSICAL THERAPY NEURO EVALUATION   Patient Name: Joshua Harrington MRN: [id number] DOB:[birth year], 61 y.o., male 84 Date: 07/09/2021  PCP: Joshua Mccreedy, MD REFERRING PROVIDER: Hosie Poisson, MD   PT End of Session - 07/09/21 1259     Visit Number 1    Number of Visits 9   8+eval   Date for PT Re-Evaluation 09/07/21    Authorization Type Cigna 2023-VL: 75 combined (PT&OT) same day = 1 visit    PT Start Time 1141    PT Stop Time 68    PT Time Calculation (min) 49 min    Equipment Utilized During Treatment Gait belt    Activity Tolerance Patient tolerated treatment well    Behavior During Therapy WFL for tasks assessed/performed             Past Medical History:  Diagnosis Date   Diabetes mellitus without complication ([place])    Hernia    High cholesterol    Hypertension    Past Surgical History:  Procedure Laterality Date   ABDOMINAL SURGERY     HERNIA REPAIR     IR ANGIO INTRA EXTRACRAN SEL COM CAROTID INNOMINATE BILAT MOD SED  07/02/2021   IR ANGIO VERTEBRAL SEL VERTEBRAL UNI L MOD SED  07/02/2021   IR ANGIO VERTEBRAL SEL VERTEBRAL UNI R MOD SED  07/02/2021   IR US GUIDE VASC ACCESS RIGHT  07/02/2021   VASECTOMY     Patient Active Problem List   Diagnosis Date Noted   History of CVA (cerebrovascular accident) 07/02/2021   Vertebral artery disease ([place]) 07/02/2021   Obesity (BMI 30-39.9) 07/02/2021   Right sided numbness 07/02/2021   Right-sided numbness secondary to CVA (cerebral vascular accident) ([place]) 07/01/2021   Essential hypertension 06/01/2021   Type 2 diabetes mellitus with complication, without long-term current use of insulin ([place]) 06/01/2021   Right pontine stroke ([place]) 05/31/2021   Hyperglycemia due to type 2 diabetes mellitus ([place]) 06/13/2020   Male hypogonadism 06/13/2020   Mixed hyperlipidemia 06/13/2020   Sciatica 06/13/2020   Vitamin D deficiency 06/13/2020   Environmental allergies 03/18/2018   Recurrent incisional hernia with  incarceration 08/05/2012    ONSET DATE: 07/03/2021   REFERRING DIAG: R20.0 (ICD-10-CM) - Right sided numbness   THERAPY DIAG:  Other abnormalities of gait and mobility  Muscle weakness (generalized)  Unsteadiness on feet  SUBJECTIVE:                                                                                                                                                                                              SUBJECTIVE STATEMENT: From prior ED visit  notes:  Pt reported to his PCP on 05/24/2021 for left hemibody weakness that began on 05/19/2021.  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  Pt reported to ED again on 07/01/2021 with numbness to his R face, arm, and leg.    From patient:  Walking and balance have been most problematic since stroke in January.  Pt states he has had a few near misses especially when reaching towards floor.  He states this happens even when sitting.  He is never dizzy.  He states his right side is still tingling. Pt accompanied by: friend-Joshua Harrington  PERTINENT HISTORY: DM2, Hyperlipidemia, HTN  PAIN:  Are you having pain? No   PRECAUTIONS: Fall  WEIGHT BEARING RESTRICTIONS No  FALLS: Has patient fallen in last 6 months? Yes, Number of falls: 1 He states he started to fall and caught himself when reaching to floor.  LIVING ENVIRONMENT: Lives with: lives with their spouse-wife, Joshua Harrington, she is in a power w/c and has received therapy here; they have a CNA home 10-5 M-F, he does not have to provide heavy assist care, he provides meal prep and general ADL management like bathing Lives in: House/apartment-apartment Stairs: No Has following equipment at home:  Wife has quad cane - he does not regularly use a cane, brought it to session at wife's request.   PLOF: Independent Pt is retired as of 1 year.  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  PATIENT GOALS Get back to where he was prior to stroke.  Improve balance and  walking.  OBJECTIVE:   DIAGNOSTIC FINDINGS:  MRI on 05/28/2021 showed acute R pontine infarct.  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  MRI from 07/02/2021 showed additional 2 acute left pontine infarcts, all other changes stable.  COGNITION: Overall cognitive status: Within functional limits for tasks assessed    SENSATION: Light touch: Deficits tingling on R shoulder area more than RLE.  Otherwise intact Hot/Cold: Appears intact-pt states he has no issues distinguishing when washing hands or in shower Proprioception: Appears intact  COORDINATION: Heel-to-shin:  RLE> difficulty than LLE Fingertip Opposition:  intact  EDEMA:  None  MUSCLE TONE: No UE/LE.   POSTURE: rounded shoulders, forward head, and posterior pelvic tilt  AROM/PROM:    A/PROM Right 07/09/2021 Left 07/09/2021  Hip flexion [facility] WFL  Hip extension WFL as assessed during gait WFL as assessed during gait  Hip abduction [facility]  Hip adduction " "  Hip internal rotation " "  Hip external rotation WFL; Leans posteriorly to cross leg WFL; Leans posteriorly to cross leg  Knee flexion [facility]  Knee extension " "  Ankle dorsiflexion Limited; stiff during PROM Limited; stiff during PROM  Ankle plantarflexion [facility]  Ankle inversion Limited; stiff Limited; stiff  Ankle eversion " "   (Blank rows = not tested) MMT:  MMT Right 07/09/2021 Left 07/09/2021  Hip flexion 3+/5 4/5  Hip extension    Hip abduction 4/5 4+/5  Hip adduction 5/5 5/5  Hip internal rotation 4-/5 4/5  Hip external rotation 4/5 4/5  Knee flexion 4-/5 5/5  Knee extension 4/5 4+/5  Ankle dorsiflexion 4-/5 4/5  Ankle plantarflexion    Ankle inversion 5/5 5/5  Ankle eversion 5/5 5/5  (Blank rows = not tested)   TRANSFERS: Assistive device utilized:  None   Sit to stand: SBA Stand to sit: SBA Chair to chair: SBA  GAIT: Gait pattern: step through pattern,  decreased arm swing- Right, decreased arm swing- Left, decreased step length- Right, decreased step length- Left, decreased stance time- Right, decreased stride length, wide BOS, poor foot clearance- Right, and poor foot clearance- Left Distance walked: 345' Assistive device utilized: None Level of assistance: CGA Comments: Pt ambulates w/ decreased R weight-bearing and occasional R knee buckle.  Notable bilateral trendelenburg, R greater than L, during stance phase.  Mild occasional L lean to compensate.  Pt demos R inattention w/ drifting to R during gait and 2 instances of near misses with objects on the right side.  Pt demos mildly decreased head turn to R when scanning and turning to R as well.    FUNCTIONAL TESTs:  5 times sit to stand: 19.66 sec Timed up and go (TUG): 15.35 sec 2 minute walk test: 287' 10 meter walk test: 0.44m/sec OR 1.24 ft/sec (normal speed); 0.61 m/sec OR 2.02 ft/sec Berg Balance Scale: 31/56   [facility] PT Assessment - 07/09/21 1224       Balance   Balance Assessed Yes      Standardized Balance Assessment   Standardized Balance Assessment Berg Balance Test      Berg Balance Test   Sit to Stand Able to stand  independently using hands    Standing Unsupported Able to stand safely 2 minutes    Sitting with Back Unsupported but Feet Supported on Floor or Stool Able to sit safely and securely 2 minutes    Stand to Sit Controls descent by using hands    Transfers Able to transfer safely, definite need of hands    Standing Unsupported with Eyes Closed Able to stand 10 seconds with supervision    Standing Unsupported with Feet Together Able to place feet together independently but unable to hold for 30 seconds    From Standing, Reach Forward with Outstretched Arm Reaches forward but needs supervision    From Standing Position, Pick up Object from Floor Able to pick up shoe, needs supervision   only with LUE, uses R to stabilize on knee   From Standing Position, Turn to  Look Behind Over each Shoulder Turn sideways only but maintains balance    Turn 360 Degrees Needs close supervision or verbal cueing   Verbal cue to complete turn, LOB w/ CGA for recovery when turning to R, slower turning to R   Standing Unsupported, Alternately Place Feet on Step/Stool Able to complete 4 steps without aid or supervision    Standing Unsupported, One Foot in [employer] balance while stepping or standing    Standing on One Leg Unable to try or needs assist to prevent fall    Total Score 31              PATIENT SURVEYS:  FOTO 58%   PATIENT EDUCATION: Education details: Edu of POC, goals assessed, and role of PT.  Answered questions as appropriate.  Initiated edu on safety at home and potential need for quad cane to be assessed in coming visits. Person educated: Patient Education method: Explanation Education comprehension: verbalized understanding   HOME EXERCISE PROGRAM: To be initiated next session.  ASSESSMENT:  CLINICAL IMPRESSION: Patient is a 61 y.o. male who was seen today for physical therapy evaluation and treatment for abnormal gait and balance following CVA.  Pt has a significant PMH of DM2, Hyperlipidemia, HTN.  Identified impairments include decreased gait speed, impaired static and dynamic balance, weakness of the right greater than left lower extremity,  mild coordination impairment of right lower extremity, mild right inattention, decreased safety awareness and insight to deficits.  Evaluation via the following assessment tools: 10MWT, TUG, BERG, and 5xSTS indicate fall risk.  They would benefit from skilled PT to address impairments as noted and progress towards long term goals.     OBJECTIVE IMPAIRMENTS Abnormal gait, decreased activity tolerance, decreased balance, decreased coordination, decreased endurance, decreased knowledge of condition, decreased knowledge of use of DME, decreased mobility, difficulty walking, decreased ROM, decreased  strength, decreased safety awareness, and impaired perceived functional ability.   ACTIVITY LIMITATIONS community activity, driving, occupation, Medical sales representative, yard work, and shopping.   PERSONAL FACTORS Fitness, Past/current experiences, Time since onset of injury/illness/exacerbation, Transportation, and 1-2 comorbidities: DM2, Hyperlipidemia, HTN  are also affecting patient's functional outcome.    REHAB POTENTIAL: Good  CLINICAL DECISION MAKING: Evolving/moderate complexity  EVALUATION COMPLEXITY: Moderate   GOALS: Goals reviewed with patient? Yes  SHORT TERM GOALS:  Pt will decrease 5xSTS to <16 seconds in order to demonstrate decreased risk for falls and improved functional bilateral LE strength and power. Baseline: 19.66 sec Target date:  08/10/2021 Goal status: INITIAL  2.  Pt will demonstrate TUG of <13 seconds in order to decrease risk of falls and improve functional mobility using LRAD. Baseline: 15.35 sec Target date:  08/10/2021 Goal status: INITIAL  3. Pt will trial quad cane with gait training in PT and PT to obtain order from MD as necessary to promote safety. Baseline: Pt walks without AD, unsteady requiring CGA to prevent R buckle. Target date:  08/10/2021 Goal status: INITIAL  4.  Pt will be independent with strength and balance HEP. Baseline: Pt does not have available supervision for exercise at home.  HEP to be initiated next session. Target date:  08/10/2021 Goal status: INITIAL    LONG TERM GOALS:  Pt will demonstrate improved R attention and scanning with use of LRAD for safety with gait.  Baseline: Pt uses no AD, mild R inattention, difficulty navigating obstacles on R Target date:  09/07/2021 Goal status: INITIAL  2.  Pt will ambulate >800' feet with LRAD and SBA level of assist to promote household and community access.  Baseline: 09/07/2021 Target date:  09/07/2021 Goal status: INITIAL  3.  Pt will increase BERG balance score to >/=40/56 to demonstrate  improved static balance. Baseline: 31/56= high fall risk Target date:  09/07/2021 Goal status: INITIAL  4.  Pt will demonstrate a gait speed of >1.75 feet/sec at normal speed in order to decrease risk for falls. Baseline: normal speed 1.24 ft/sec Target date:  09/07/2021 Goal status: INITIAL  5.  Pt will increase FOTO score to 76% in order to demonstrate subjective functional improvement.  Baseline: 58% Target date:  09/07/2021 Goal status: INITIAL   PLAN: PT FREQUENCY: 1x/week  PT DURATION: 8 weeks  PLANNED INTERVENTIONS: Therapeutic exercises, Therapeutic activity, Neuromuscular re-education, Balance training, Gait training, Patient/Family education, Joint mobilization, Stair training, Vestibular training, and DME instructions  PLAN FOR NEXT SESSION: Initiate strength and balance HEP, static and dynamic balance, functional activities, gait training with quad cane, hip and knee strengthening   Bary Richard, PT, DPT 07/09/2021, 1:03 PM

## 2021-07-09 NOTE — Telephone Encounter (Signed)
? ?  Telephone encounter was:  Unsuccessful.  07/09/2021 ?Name: Mckoy Bhakta MRN: [id number] DOB: [birth year] ? ?Unsuccessful outbound call made today to assist with:  Transportation Needs  and Food Insecurity ? ?Outreach Attempt:  1st Attempt ? ?A HIPAA compliant voice message was left requesting a return call.  Instructed patient to call back at   Instructed patient to call back at [phone]  at their earliest convenience. . ? ?Destany Severns Greenauer -Berneda Rose ?Care Guide , Embedded Care Coordination ?[place], Care Management  ?[phone] ?[address] [place] , [place] [address] ?Email : [email or web address] ?  ?

## 2021-07-09 NOTE — Therapy (Signed)
[facility] [address] [place], [place], [address] Phone: [phone]   Fax:  [phone]  Occupational Therapy Evaluation  Patient Details  Name: Joshua Harrington MRN: [id number] Date of Birth: 1960/06/22 Referring Provider (OT): Dr. Elvera Bicker (PCP)   Encounter Date: 07/09/2021   OT End of Session - 07/09/21 1159     Visit Number 1    Number of Visits 17    Date for OT Re-Evaluation 09/08/21    Authorization Type Cigna/Cigna Managed - 30 combined visits (PT/OT); counts as 1 if seen on same day    OT Start Time 1100    OT Stop Time 1140    OT Time Calculation (min) 40 min    Activity Tolerance Patient tolerated treatment well    Behavior During Therapy [facility] for tasks assessed/performed             Past Medical History:  Diagnosis Date   Diabetes mellitus without complication (HCC)    Hernia    High cholesterol    Hypertension     Past Surgical History:  Procedure Laterality Date   ABDOMINAL SURGERY     HERNIA REPAIR     IR ANGIO INTRA EXTRACRAN SEL COM CAROTID INNOMINATE BILAT MOD SED  07/02/2021   IR ANGIO VERTEBRAL SEL VERTEBRAL UNI L MOD SED  07/02/2021   IR ANGIO VERTEBRAL SEL VERTEBRAL UNI R MOD SED  07/02/2021   IR US GUIDE VASC ACCESS RIGHT  07/02/2021   VASECTOMY      There were no vitals filed for this visit.   Subjective Assessment - 07/09/21 1104     Pertinent History Lt pontine CVA 07/01/21. PMH: HLD, HTN, T2DM, h/o CVA (Jan 2023), VAD    Limitations Fall risk, ? driving    Patient Stated Goals Get back to normal    Currently in Pain? No/denies               [facility] OT Assessment - 07/09/21 0001       Assessment   Medical Diagnosis Lt pontine CVA    Referring Provider (OT) Dr. Elvera Bicker   PCP   Onset Date/Surgical Date 07/01/21    Hand Dominance Right    Next MD Visit 07/12/21    Prior Therapy NONE      Precautions   Precautions None    Precaution Comments ? fall risk       Restrictions   Weight Bearing Restrictions No      Balance Screen   Has the patient fallen in the past 6 months No      Home  Environment   Bathroom Shower/Tub Tub/Shower unit;Curtain   grab bars   Home Equipment --   Wife has DME   Additional Comments Pt lives on first floor apartment w/ level entry. Wife is disabled and has aide M-F 10-5 pm    Lives With Spouse   disabled     Prior Function   Level of Independence Independent    Vocation Retired    Arts administrator work, Technical sales engineer (Development worker, community)      ADL   Eating/Feeding Modified independent   difficulty holding utensil   Grooming Modified independent   Architect Dressing Increased time    Lower Body Dressing Increased time    Lobbyist - Clothing Manipulation Modified independent  Toileting -  Art therapist Modified independent      IADL   Shopping --   pt has done, but wife's aide has been doing since stroke   Light Housekeeping --   Aide has been doing this for a long time   Meal Prep Able to complete simple cold meal and snack prep;Able to complete simple warm meal prep   Aide has been doing cooking   Engineer, drilling on family or friends for transportation   has driven around parking lot since stroke   Medication Management Is responsible for taking medication in correct dosages at correct time    Development worker, community financial matters independently (budgets, writes checks, pays rent, bills goes to bank), collects and keeps track of income      Mobility   Mobility Status Independent    Mobility Status Comments has been using wife's quad cane as a precaution      Written Expression   Dominant Hand Right    Handwriting 75% legible   impaired since recent stroke     Vision - History   Baseline Vision Wears glasses for  distance only    Additional Comments none      Vision Assessment   Comment denies changes      Cognition   Overall Cognitive Status Within Functional Limits for tasks assessed      Sensation   Additional Comments intact for light touch and localization, but reports numbness/tingling      Coordination   9 Hole Peg Test Right;Left    Right 9 Hole Peg Test 38.84 SEC    Left 9 Hole Peg Test 32.91 SEC      Edema   Edema none in UE's      ROM / Strength   AROM / PROM / Strength AROM;Strength      AROM   Overall AROM Comments BUE AROM WFL's - difficulty and slight drop with RUE sh flexion      Strength   Overall Strength Comments However, with MMT LUE weaker grossly 3+/5, RUE 4+/5      Hand Function   Right Hand Grip (lbs) 62.1 LBS    Left Hand Grip (lbs) 74.2 LBS                                OT Short Term Goals - 07/09/21 1214       OT SHORT TERM GOAL #1   Title Independent with HEP for coordination and strength    Time 4    Period Weeks    Status New      OT SHORT TERM GOAL #2   Title Pt to improve coordination Rt hand to 30 sec or less    Baseline 38 sec    Time 4    Period Weeks    Status New      OT SHORT TERM GOAL #3   Title Grip strength Rt hand to be 68 lbs or greater in prep for return to leisure activities (car mechanics)    Baseline 62 lbs    Time 4    Period Weeks    Status New      OT SHORT TERM GOAL #4   Title Pt to report greater ease eating with built up utensils prn    Time 4    Period Weeks    Status New  OT SHORT TERM GOAL #5   Title Pt to write name at 90% or greater legibility    Baseline 75%    Time 4    Period Weeks    Status New               OT Long Term Goals - 07/09/21 1217       OT LONG TERM GOAL #1   Title Independent with updated HEP prn    Time 8    Period Weeks    Status New      OT LONG TERM GOAL #2   Title Improve coordination Rt hand as evidenced by performing 9 hole peg  test in 26 sec or less    Baseline 38 sec    Time 8    Period Weeks    Status New      OT LONG TERM GOAL #3   Title Pt to write 3 sentences maintaining 90% or greater legibility    Time 8    Period Weeks    Status New      OT LONG TERM GOAL #4   Title Pt to return to leisure activities (playing guitar, drums, and light mechanics) with increased time prn    Time 8    Period Weeks    Status New      OT LONG TERM GOAL #5   Title Pt to return to cooking at PLOF safely w/ distant supervision    Time 8    Period Weeks    Status New      Long Term Additional Goals   Additional Long Term Goals Yes      OT LONG TERM GOAL #6   Title Pt to perform environmental scanning in busy gym w/ simple physical task at 85% or greater legibility    Time 8    Period Weeks    Status New                   Plan - 07/09/21 1201     Clinical Impression Statement Pt is a 61 y/o male presenting on 07/01/21 with R sided tingling and numbness. CT negative, MRI showed 2 acute Lt pons infarcts. Pt also had Rt pontine stroke in January 2023. PMH includes: HTN, HLD, T2DM, VAD. Pt presents today with decreased coordination and grip strength Rt hand, and decreased strength Lt shoulder. Pt would benefit from O.T. to address these deficits and increase functional performance of RUE in ADLS including writing.    OT Occupational Profile and History Problem Focused Assessment - Including review of records relating to presenting problem    Occupational performance deficits (Please refer to evaluation for details): IADL's;Leisure;ADL's    Body Structure / Function / Physical Skills Strength;ADL;Decreased knowledge of use of DME;Dexterity;Balance;UE functional use;IADL;ROM;Coordination;FMC;Sensation    Rehab Potential Good    Clinical Decision Making Several treatment options, min-mod task modification necessary    Comorbidities Affecting Occupational Performance: May have comorbidities impacting occupational  performance    Modification or Assistance to Complete Evaluation  No modification of tasks or assist necessary to complete eval    OT Frequency 2x / week    OT Duration 8 weeks   plus evaluation (anticipate only 6 weeks needed)   OT Treatment/Interventions Self-care/ADL training;DME and/or AE instruction;Therapeutic activities;Coping strategies training;Therapeutic exercise;Neuromuscular education;Patient/family education;Manual Therapy;Functional Mobility Training;Visual/perceptual remediation/compensation    Plan issue coordination HEP for bilateral hands, putty HEP for Rt hand, and low range theraband HEP for BUE's (Lt shoulder  weaker)    Consulted and Agree with Plan of Care Patient             Patient will benefit from skilled therapeutic intervention in order to improve the following deficits and impairments:   Body Structure / Function / Physical Skills: Strength, ADL, Decreased knowledge of use of DME, Dexterity, Balance, UE functional use, IADL, ROM, Coordination, FMC, Sensation       Visit Diagnosis: Other lack of coordination  Other disturbances of skin sensation  Muscle weakness (generalized)  Unsteadiness on feet    Problem List Patient Active Problem List   Diagnosis Date Noted   History of CVA (cerebrovascular accident) 07/02/2021   Vertebral artery disease (HCC) 07/02/2021   Obesity (BMI 30-39.9) 07/02/2021   Right sided numbness 07/02/2021   Right-sided numbness secondary to CVA (cerebral vascular accident) (HCC) 07/01/2021   Essential hypertension 06/01/2021   Type 2 diabetes mellitus with complication, without long-term current use of insulin (HCC) 06/01/2021   Right pontine stroke (HCC) 05/31/2021   Hyperglycemia due to type 2 diabetes mellitus (HCC) 06/13/2020   Male hypogonadism 06/13/2020   Mixed hyperlipidemia 06/13/2020   Sciatica 06/13/2020   Vitamin D deficiency 06/13/2020   Environmental allergies 03/18/2018   Recurrent incisional hernia  with incarceration 08/05/2012    Kelli ChurnBallie, Deliana Avalos Johnson, OTR/L 07/09/2021, 12:21 PM  [facility] [address] [place], [place], [address] Phone: [phone]   Fax:  [phone]  Name: Marlon PelCliff Harrington MRN: [id number] Date of Birth: Feb 15, 1961

## 2021-07-10 ENCOUNTER — Telehealth: Payer: Self-pay | Admitting: *Deleted

## 2021-07-10 NOTE — Telephone Encounter (Signed)
? ?  Telephone encounter was:  Successful.  ?07/10/2021 ?Name: Joshua Harrington MRN: [id number] DOB: [birth year] ? ?[name] is a 61 y.o. year old male who is a primary care patient of Osei-Bonsu, Greggory Stallion, MD . The community resource team was consulted for assistance with Transportation Needs  ? ?Care guide performed the following interventions: Patient provided with information about care guide support team and interviewed to confirm resource needs. ? ?Follow Up Plan:  No further follow up planned at this time. The patient has been provided with needed resources. ? ?[name] ?Care Guide , Embedded Care Coordination ?[place], Care Management  ?[phone] ?[address] [place] , [place] [address] ?Email : [email or web address] ?  ?

## 2021-07-11 ENCOUNTER — Other Ambulatory Visit: Payer: Self-pay

## 2021-07-11 ENCOUNTER — Ambulatory Visit: Payer: Commercial Managed Care - HMO | Admitting: Occupational Therapy

## 2021-07-11 ENCOUNTER — Ambulatory Visit: Payer: Commercial Managed Care - HMO | Admitting: Physical Therapy

## 2021-07-11 DIAGNOSIS — R278 Other lack of coordination: Secondary | ICD-10-CM

## 2021-07-11 DIAGNOSIS — R2681 Unsteadiness on feet: Secondary | ICD-10-CM

## 2021-07-11 DIAGNOSIS — M6281 Muscle weakness (generalized): Secondary | ICD-10-CM

## 2021-07-11 DIAGNOSIS — R2689 Other abnormalities of gait and mobility: Secondary | ICD-10-CM | POA: Diagnosis not present

## 2021-07-11 NOTE — Therapy (Signed)
[facility] ?[facility] ?[address] ?[place], [place], [address] ?Phone: [phone]   Fax:  [phone] ? ?Occupational Therapy Treatment ? ?Patient Details  ?Name: Joshua Harrington ?MRN: [id number] ?Date of Birth: 1960/09/20 ?Referring Provider (OT): Dr. Venida Jarvis (PCP) ? ? ?Encounter Date: 07/11/2021 ? ? OT End of Session - 07/11/21 1345   ? ? Visit Number 2   ? Number of Visits 17   ? Date for OT Re-Evaluation 09/08/21   ? Authorization Type Cigna/Cigna Managed - 30 combined visits (PT/OT); counts as 1 if seen on same day   ? OT Start Time 1230   ? OT Stop Time 1315   ? OT Time Calculation (min) 45 min   ? Activity Tolerance Patient tolerated treatment well   ? Behavior During Therapy [facility] for tasks assessed/performed   ? ?  ?  ? ?  ? ? ?Past Medical History:  ?Diagnosis Date  ? Diabetes mellitus without complication (HCC)   ? Hernia   ? High cholesterol   ? Hypertension   ? ? ?Past Surgical History:  ?Procedure Laterality Date  ? ABDOMINAL SURGERY    ? HERNIA REPAIR    ? IR ANGIO INTRA EXTRACRAN SEL COM CAROTID INNOMINATE BILAT MOD SED  07/02/2021  ? IR ANGIO VERTEBRAL SEL VERTEBRAL UNI L MOD SED  07/02/2021  ? IR ANGIO VERTEBRAL SEL VERTEBRAL UNI R MOD SED  07/02/2021  ? IR US GUIDE VASC ACCESS RIGHT  07/02/2021  ? VASECTOMY    ? ? ?There were no vitals filed for this visit. ? ? Subjective Assessment - 07/11/21 1235   ? ? Subjective  I'm doing ok   ? 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 PMH: HLD, HTN, T2DM, h/o CVA (Jan 2023), VAD   ? Limitations Fall risk, ? driving   ? Patient Stated Goals Get back to normal   ? Currently in Pain? No/denies   ? ?  ?  ? ?  ? ? ?See below education - see pt instructions for details ? ? ? ? ? ? ? ? ? ? ? ? ? ? ? ? ? ? ? ? OT Education - 07/11/21 1240   ? ? Education Details coordination HEP, putty HEP, Theraband HEP   ? Person(s) Educated Patient   ? Methods Explanation;Demonstration;Verbal cues;Handout   ? Comprehension Verbalized  understanding;Returned demonstration   ? ?  ?  ? ?  ? ? ? OT Short Term Goals - 07/11/21 1345   ? ?  ? OT SHORT TERM GOAL #1  ? Title Independent with HEP for coordination and strength   ? Time 4   ? Period Weeks   ? Status On-going   ?  ? OT SHORT TERM GOAL #2  ? Title Pt to improve coordination Rt hand to 30 sec or less   ? Baseline 38 sec   ? Time 4   ? Period Weeks   ? Status New   ?  ? OT SHORT TERM GOAL #3  ? 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 Baseline 62 lbs   ? Time 4   ? Period Weeks   ? Status New   ?  ? OT SHORT TERM GOAL #4  ? Title Pt to report greater ease eating with built up utensils prn   ? Time 4   ? Period Weeks   ? Status New   ?  ?  OT SHORT TERM GOAL #5  ? Title Pt to write name at 90% or greater legibility   ? Baseline 75%   ? Time 4   ? Period Weeks   ? Status New   ? ?  ?  ? ?  ? ? ? ? OT Long Term Goals - 07/09/21 1217   ? ?  ? OT LONG TERM GOAL #1  ? Title Independent with updated HEP prn   ? Time 8   ? Period Weeks   ? Status New   ?  ? OT LONG TERM GOAL #2  ? Title Improve coordination Rt hand as evidenced by performing 9 hole peg test in 26 sec or less   ? Baseline 38 sec   ? Time 8   ? Period Weeks   ? Status New   ?  ? OT LONG TERM GOAL #3  ? Title Pt to write 3 sentences maintaining 90% or greater legibility   ? Time 8   ? Period Weeks   ? Status New   ?  ? OT LONG TERM GOAL #4  ? Title Pt to return to leisure activities (playing guitar, drums, and light mechanics) with increased time prn   ? Time 8   ? Period Weeks   ? Status New   ?  ? OT LONG TERM GOAL #5  ? Title Pt to return to cooking at [facility] safely w/ distant supervision   ? Time 8   ? Period Weeks   ? Status New   ?  ? Long Term Additional Goals  ? Additional Long Term Goals Yes   ?  ? OT LONG TERM GOAL #6  ? Title Pt to perform environmental scanning in busy gym w/ simple physical task at 85% or greater legibility   ? Time 8   ? Period Weeks   ?  Status New   ? ?  ?  ? ?  ? ? ? ? ? ? ? ? Plan - 07/11/21 1345   ? ? Clinical Impression Statement Pt progressing towards STG #1. Pt has more deficits Rt hand > Lt hand, however weakness in Lt shoulder > Rt shoulder.   ? OT Occupational Profile and History Problem Focused Assessment - Including review of records relating to presenting problem   ? Occupational performance deficits (Please refer to evaluation for details): IADL's;Leisure;ADL's   ? Body Structure / Function / Physical Skills Strength;ADL;Decreased knowledge of use of DME;Dexterity;Balance;UE functional use;IADL;ROM;Coordination;FMC;Sensation   ? Rehab Potential Good   ? Clinical Decision Making Several treatment options, min-mod task modification necessary   ? Comorbidities Affecting Occupational Performance: May have comorbidities impacting occupational performance   ? Modification or Assistance to Complete Evaluation  No modification of tasks or assist necessary to complete eval   ? OT Frequency 2x / week   ? OT Duration 8 weeks   plus evaluation (anticipate only 6 weeks needed)  ? OT Treatment/Interventions Self-care/ADL training;DME and/or AE instruction;Therapeutic activities;Coping strategies training;Therapeutic exercise;Neuromuscular education;Patient/family education;Manual Therapy;Functional Mobility Training;Visual/perceptual remediation/compensation   ? Plan review HEP's prn, continue progress towards STG's   ? Consulted and Agree with Plan of Care Patient   ? ?  ?  ? ?  ? ? ?Patient will benefit from skilled therapeutic intervention in order to improve the following deficits and impairments:   ?Body Structure / Function / Physical Skills: Strength, ADL, Decreased knowledge of use of DME, Dexterity, Balance, UE functional use, IADL, ROM, Coordination, FMC, Sensation ?  ?  ? ? ?  Visit Diagnosis: ?Other lack of coordination ? ?Muscle weakness (generalized) ? ? ? ?Problem List ?Patient Active Problem List  ? Diagnosis Date Noted  ? History of  CVA (cerebrovascular accident) 07/02/2021  ? Vertebral artery disease (HCC) 07/02/2021  ? Obesity (BMI 30-39.9) 07/02/2021  ? Right sided numbness 07/02/2021  ? Right-sided numbness secondary to CVA (cerebral vascular accident) (HCC) 07/01/2021  ? Essential hypertension 06/01/2021  ? Type 2 diabetes mellitus with complication, without long-term current use of insulin (HCC) 06/01/2021  ? Right pontine stroke (HCC) 05/31/2021  ? Hyperglycemia due to type 2 diabetes mellitus (HCC) 06/13/2020  ? Male hypogonadism 06/13/2020  ? Mixed hyperlipidemia 06/13/2020  ? Sciatica 06/13/2020  ? Vitamin D deficiency 06/13/2020  ? Environmental allergies 03/18/2018  ? Recurrent incisional hernia with incarceration 08/05/2012  ? ? ?Kelli ChurnBallie, Eleana Tocco Johnson, OTR/L ?07/11/2021, 1:47 PM ? ? ?[facility] ?[address] ?[place], [place], [address] ?Phone: [phone]   Fax:  [phone] ? ?Name: Marlon PelCliff Limburg ?MRN: [id number] ?Date of Birth: [birth year] ? ?

## 2021-07-11 NOTE — Therapy (Signed)
OUTPATIENT PHYSICAL THERAPY TREATMENT NOTE   Patient Name: Joshua Harrington MRN: [id number] DOB:May 02, 1961, 61 y.o., male 64 Date: 07/11/2021  PCP: [name], MD REFERRING PROVIDER: Hosie Poisson, MD   PT End of Session - 07/11/21 1528     Visit Number 2    Number of Visits 9   8+eval   Date for PT Re-Evaluation 09/07/21    Authorization Type Cigna 2023-VL: 54 combined (PT&OT) same day = 1 visit    PT Start Time 1315    PT Stop Time 1359    PT Time Calculation (min) 44 min    Equipment Utilized During Treatment Gait belt    Activity Tolerance Patient tolerated treatment well    Behavior During Therapy WFL for tasks assessed/performed             Past Medical History:  Diagnosis Date   Diabetes mellitus without complication ([place])    Hernia    High cholesterol    Hypertension    Past Surgical History:  Procedure Laterality Date   ABDOMINAL SURGERY     HERNIA REPAIR     IR ANGIO INTRA EXTRACRAN SEL COM CAROTID INNOMINATE BILAT MOD SED  07/02/2021   IR ANGIO VERTEBRAL SEL VERTEBRAL UNI L MOD SED  07/02/2021   IR ANGIO VERTEBRAL SEL VERTEBRAL UNI R MOD SED  07/02/2021   IR US GUIDE VASC ACCESS RIGHT  07/02/2021   VASECTOMY     Patient Active Problem List   Diagnosis Date Noted   History of CVA (cerebrovascular accident) 07/02/2021   Vertebral artery disease ([place]) 07/02/2021   Obesity (BMI 30-39.9) 07/02/2021   Right sided numbness 07/02/2021   Right-sided numbness secondary to CVA (cerebral vascular accident) ([place]) 07/01/2021   Essential hypertension 06/01/2021   Type 2 diabetes mellitus with complication, without long-term current use of insulin ([place]) 06/01/2021   Right pontine stroke ([place]) 05/31/2021   Hyperglycemia due to type 2 diabetes mellitus ([place]) 06/13/2020   Male hypogonadism 06/13/2020   Mixed hyperlipidemia 06/13/2020   Sciatica 06/13/2020   Vitamin D deficiency 06/13/2020   Environmental allergies 03/18/2018   Recurrent incisional hernia with  incarceration 08/05/2012    REFERRING DIAG: R20.0 (ICD-10-CM) - Right sided numbness    THERAPY DIAG:  Other lack of coordination  Muscle weakness (generalized)  Unsteadiness on feet  PERTINENT HISTORY: DM2, Hyperlipidemia, HTN   PRECAUTIONS: Fall  SUBJECTIVE: "Things are getting better, my fine motor skills are improving". No new changes. Reported some irritation/discomfort in L shoulder w/elevation, but no pain   PAIN:  Are you having pain? No  VITALS -Pre-session BP taken in LUE in seated position: 139/72 mmHg   OBJECTIVE:        TREATMENT    Implemented and demonstrated initial HEP (see below)    GAIT TRAINING: Gait pattern: step through pattern, decreased step length- Right, ataxic, and poor foot clearance- Right Distance walked: 230' Assistive device utilized: None Level of assistance: CGA Comments: Noted lateral gait deviations (to L side) and single posterior LOB episode w/turn requiring min A to correct.   Gait pattern: step to pattern, decreased arm swing- Left, decreased step length- Right, decreased step length- Left, decreased stride length, lateral lean- Right, and poor foot clearance- Right Distance walked: 230' Assistive device utilized: Single point cane w/quad tip Level of assistance: CGA Comments: Noted decreased cadence compared to gait without AD, increased posterolateral instability, increased lateral gait deviations to L side. Pt advised not to use cane at this time.   NMR -  Alt. Taps to multi-colored dots for BLE coordination, single leg stability and visual scanning, x30 taps total. Min guard throughout for steadying assist. Noted significant instability when tapping to R side and difficulty finding dots on L side. Started w/calling out one color at a time and progressed to 3 at a time for increased dual-task challenge.     PATIENT EDUCATION: Education details: Safe setup of HEP at home, gait deviations noted with SPC vs no AD, advised pt not to  use SPC at this time Person educated: Patient Education method: Explanation and handout Education comprehension: verbalized understanding     HOME EXERCISE PROGRAM: Access Code: [email or web address] URL: [email or web address] Date: 07/11/2021 Prepared by: Mickie Bail Basia Mcginty  Exercises Side Stepping with Resistance at Thighs and Counter Support - 1 x daily - 7 x weekly - 3 sets - 10 reps Seated March with Resistance - 1 x daily - 7 x weekly - 3 sets - 10 reps - 2s hold Sit to Stand with Resistance Around Legs - 1 x daily - 7 x weekly - 3 sets - 10 reps Forward Backward Monster Walk with Band at Thighs and Counter Support - 1 x daily - 7 x weekly - 3 sets - 10 reps    ASSESSMENT:   CLINICAL IMPRESSION: Emphasis of skilled PT session on establishing HEP to address balance and BLE weakness, gait training w/ and w/o AD and BLE coordination. Pt demonstrated greater gait impairments w/use of SPC and multiple LOB episodes compared to gait without AD and is advised to not use cane at this time. Pt continues to demonstrate poor peripheral vision and L inattention, frequently walking into obstacles on L side. Noted decreased BLE coordination (RLE > LLE) w/single leg stability tasks. Continue POC.       OBJECTIVE IMPAIRMENTS Abnormal gait, decreased activity tolerance, decreased balance, decreased coordination, decreased endurance, decreased knowledge of condition, decreased knowledge of use of DME, decreased mobility, difficulty walking, decreased ROM, decreased strength, decreased safety awareness, and impaired perceived functional ability.    ACTIVITY LIMITATIONS community activity, driving, occupation, Medical sales representative, yard work, and shopping.    PERSONAL FACTORS Fitness, Past/current experiences, Time since onset of injury/illness/exacerbation, Transportation, and 1-2 comorbidities: DM2, Hyperlipidemia, HTN  are also affecting patient's functional outcome.        GOALS: Goals reviewed with patient?  Yes   SHORT TERM GOALS:   Pt will decrease 5xSTS to <16 seconds in order to demonstrate decreased risk for falls and improved functional bilateral LE strength and power. Baseline: 19.66 sec Target date:  08/10/2021 Goal status: INITIAL   2.  Pt will demonstrate TUG of <13 seconds in order to decrease risk of falls and improve functional mobility using LRAD. Baseline: 15.35 sec Target date:  08/10/2021 Goal status: INITIAL   3. Pt will trial quad cane with gait training in PT and PT to obtain order from MD as necessary to promote safety. Baseline: Pt walks without AD, unsteady requiring CGA to prevent R buckle. Target date:  08/10/2021 Goal status: INITIAL   4.  Pt will be independent with strength and balance HEP. Baseline: Pt does not have available supervision for exercise at home.  HEP to be initiated next session. Target date:  08/10/2021 Goal status: INITIAL       LONG TERM GOALS:   Pt will demonstrate improved R attention and scanning with use of LRAD for safety with gait.  Baseline: Pt uses no AD, mild R inattention, difficulty navigating obstacles on R Target date:  09/07/2021  Goal status: INITIAL   2.  Pt will ambulate >800' feet with LRAD and SBA level of assist to promote household and community access.  Baseline: 09/07/2021 Target date:  09/07/2021 Goal status: INITIAL   3.  Pt will increase BERG balance score to >/=40/56 to demonstrate improved static balance. Baseline: 31/56= high fall risk Target date:  09/07/2021 Goal status: INITIAL   4.  Pt will demonstrate a gait speed of >1.75 feet/sec at normal speed in order to decrease risk for falls. Baseline: normal speed 1.24 ft/sec Target date:  09/07/2021 Goal status: INITIAL   5.  Pt will increase FOTO score to 76% in order to demonstrate subjective functional improvement.  Baseline: 58% Target date:  09/07/2021 Goal status: INITIAL     PLAN: PT FREQUENCY: 1x/week   PT DURATION: 8 weeks   PLANNED INTERVENTIONS:  Therapeutic exercises, Therapeutic activity, Neuromuscular re-education, Balance training, Gait training, Patient/Family education, Joint mobilization, Stair training, Vestibular training, and DME instructions   PLAN FOR NEXT SESSION: How is HEP? Update/review prn, static and dynamic balance, functional activities, obstacle course, visual scanning tasks     Cruzita Lederer Atzel Mccambridge, PT, DPT 07/11/2021, 3:30 PM

## 2021-07-11 NOTE — Patient Instructions (Signed)
?  Coordination Activities ? ?Perform the following activities for 15 minutes 1-2 times per day with both hand(s), however spend more time on Rt hand ? ?Rotate ball in fingertips (clockwise and counter-clockwise). ?Toss ball in air and catch with the same hand. ?Place cards on table, then flip cards 1 at a time as fast as you can. ?Deal cards with your thumb (Hold deck in hand and push card off top with thumb). ?Rotate one card in hand (clockwise and counter-clockwise). ?Pick up coins, buttons, marbles, dried beans/pasta of different sizes and place in container. ?Pick up coins one at a time until you get 5 in your hand, then move coins from palm to fingertips to stack one at a time. (Do 3 stacks of 5) ?Practice writing and/or typing. ?Screw together nuts and bolts, then unfasten. ? ?1. Grip Strengthening (Resistive Putty) ? ? ?Squeeze putty with Rt hand.  ?Repeat _20___ times. Do __2__ sessions per day. ? ? ?2. Roll putty into tube on table and pinch between first two fingers and thumb x 10 reps Rt hand. Do 2 sessions per day.  ? ? ?Strengthening: Resisted Flexion ? ? ?Hold tubing with __Lt___ arm(s) at side. Pull forward and up. Move shoulder through pain-free range of motion. ?Repeat __10__ times per set.  Do _1-2_ sessions per day , every other day ? ? ?Strengthening: Resisted Extension ? ? ?Hold tubing in _Lt__ hand(s), arm forward. Pull arm back, elbow straight. ?Repeat _10___ times per set. Do _1-2___ sessions per day, every other day. ? ? ?

## 2021-07-13 ENCOUNTER — Telehealth: Payer: Self-pay | Admitting: *Deleted

## 2021-07-13 NOTE — Telephone Encounter (Signed)
? ?  Telephone encounter was:  Successful.  ?07/13/2021 ?Name: Joshua Harrington MRN: [id number] DOB: 25-Feb-1961 ? ?Joshua Harrington is a 61 y.o. year old male who is a primary care patient of Osei-Bonsu, Greggory Stallion, MD . The community resource team was consulted for assistance with Transportation Needs  ? ?Care guide performed the following interventions: Patient provided with information about care guide support team and interviewed to confirm resource needs. ? ?Patient had called careguide because previous ride the driver told him was only one way , the transportaion was notified that both rides were put in round trip and that the patient would need round trip on the previously scheduled 07/16/2021 ? ?Follow Up Plan:  No further follow up planned at this time. The patient has been provided with needed resources. ?[name] ?Care Guide , Embedded Care Coordination ?[place], Care Management  ?[phone] ?[address] [place] , [place] [address] ?Email : [email or web address] ?   ?

## 2021-07-16 ENCOUNTER — Ambulatory Visit: Payer: Commercial Managed Care - HMO | Admitting: Physical Therapy

## 2021-07-16 ENCOUNTER — Telehealth: Payer: Self-pay | Admitting: *Deleted

## 2021-07-16 ENCOUNTER — Encounter: Payer: Self-pay | Admitting: Physical Therapy

## 2021-07-16 ENCOUNTER — Other Ambulatory Visit: Payer: Self-pay

## 2021-07-16 ENCOUNTER — Ambulatory Visit: Payer: Commercial Managed Care - HMO | Admitting: Occupational Therapy

## 2021-07-16 ENCOUNTER — Encounter: Payer: Self-pay | Admitting: Occupational Therapy

## 2021-07-16 DIAGNOSIS — R2689 Other abnormalities of gait and mobility: Secondary | ICD-10-CM | POA: Diagnosis not present

## 2021-07-16 DIAGNOSIS — R2681 Unsteadiness on feet: Secondary | ICD-10-CM

## 2021-07-16 DIAGNOSIS — M6281 Muscle weakness (generalized): Secondary | ICD-10-CM

## 2021-07-16 DIAGNOSIS — R278 Other lack of coordination: Secondary | ICD-10-CM

## 2021-07-16 DIAGNOSIS — R208 Other disturbances of skin sensation: Secondary | ICD-10-CM

## 2021-07-16 NOTE — Therapy (Signed)
[facility] ?[address] ?[place], [place], [address] ?Phone: [phone]   Fax:  [phone] ? ?Occupational Therapy Treatment ? ?Patient Details  ?Name: Joshua Harrington ?MRN: [id number] ?Date of Birth: [birth year] ?Referring Provider (OT): Dr. Venida Jarvis (PCP) ? ? ?Encounter Date: 07/16/2021 ? ? OT End of Session - 07/16/21 1109   ? ? Visit Number 3   ? Number of Visits 17   ? Date for OT Re-Evaluation 09/08/21   ? Authorization Type Cigna/Cigna Managed - 30 combined visits (PT/OT); counts as 1 if seen on same day   ? OT Start Time 1106   ? OT Stop Time 1145   ? OT Time Calculation (min) 39 min   ? Activity Tolerance Patient tolerated treatment well   ? Behavior During Therapy [facility] for tasks assessed/performed   ? ?  ?  ? ?  ? ? ?Past Medical History:  ?Diagnosis Date  ? Diabetes mellitus without complication (HCC)   ? Hernia   ? High cholesterol   ? Hypertension   ? ? ?Past Surgical History:  ?Procedure Laterality Date  ? ABDOMINAL SURGERY    ? HERNIA REPAIR    ? IR ANGIO INTRA EXTRACRAN SEL COM CAROTID INNOMINATE BILAT MOD SED  07/02/2021  ? IR ANGIO VERTEBRAL SEL VERTEBRAL UNI L MOD SED  07/02/2021  ? IR ANGIO VERTEBRAL SEL VERTEBRAL UNI R MOD SED  07/02/2021  ? IR US GUIDE VASC ACCESS RIGHT  07/02/2021  ? VASECTOMY    ? ? ?There were no vitals filed for this visit. ? ? Subjective Assessment - 07/16/21 1107   ? ? Subjective  Pt reports that his hand is getting better.  He reports no problems with HEP.   ? 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 PMH: HLD, HTN, T2DM, h/o CVA (Jan 2023), VAD   ? Limitations Fall risk, ? driving   ? Currently in Pain? No/denies   ? ?  ?  ? ?  ? ? ?Practiced writing 4 sentences with 95% legibility with incr time.  Pt reports improvement--encouraged pt to practice writing at home. ? ?Placing grooved pegs in pegboard with R hand with min difficulty/incr time. ? ?Reviewed red theraband HEP (shoulder flex and ext) x12 each with min  cueing to slow down and move through full ROM. ? ?Picking up blocks with gripper set on level 3 (black spring) for sustained grip strength with min-mod difficulty. ? ?Placing small pegs in pegboard to copy complex design with min difficulty/cues--pt appeared to demo more difficulty with L side of design.  Manipulating 3 pegs in hand for incr coordination when placing pegs in pegboard with min dificulty/incr time. ? ? ? ? ? ? OT Short Term Goals - 07/11/21 1345   ? ?  ? OT SHORT TERM GOAL #1  ? Title Independent with HEP for coordination and strength   ? Time 4   ? Period Weeks   ? Status On-going   ?  ? OT SHORT TERM GOAL #2  ? Title Pt to improve coordination Rt hand to 30 sec or less   ? Baseline 38 sec   ? Time 4   ? Period Weeks   ? Status New   ?  ? OT SHORT TERM GOAL #3  ? 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 Baseline 62 lbs   ? Time 4   ? Period  Weeks   ? Status New   ?  ? OT SHORT TERM GOAL #4  ? Title Pt to report greater ease eating with built up utensils prn   ? Time 4   ? Period Weeks   ? Status New   ?  ? OT SHORT TERM GOAL #5  ? Title Pt to write name at 90% or greater legibility   ? Baseline 75%   ? Time 4   ? Period Weeks   ? Status New   ? ?  ?  ? ?  ? ? ? ? OT Long Term Goals - 07/09/21 1217   ? ?  ? OT LONG TERM GOAL #1  ? Title Independent with updated HEP prn   ? Time 8   ? Period Weeks   ? Status New   ?  ? OT LONG TERM GOAL #2  ? Title Improve coordination Rt hand as evidenced by performing 9 hole peg test in 26 sec or less   ? Baseline 38 sec   ? Time 8   ? Period Weeks   ? Status New   ?  ? OT LONG TERM GOAL #3  ? Title Pt to write 3 sentences maintaining 90% or greater legibility   ? Time 8   ? Period Weeks   ? Status New   ?  ? OT LONG TERM GOAL #4  ? Title Pt to return to leisure activities (playing guitar, drums, and light mechanics) with increased time prn   ? Time 8   ? Period Weeks   ? Status New   ?  ? OT  LONG TERM GOAL #5  ? 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 Time 8   ? Period Weeks   ? Status New   ?  ? Long Term Additional Goals  ? Additional Long Term Goals Yes   ?  ? OT LONG TERM GOAL #6  ? Title Pt to perform environmental scanning in busy gym w/ simple physical task at 85% or greater legibility   ? Time 8   ? Period Weeks   ? Status New   ? ?  ?  ? ?  ? ? ? ? ? ? ? ? Plan - 07/16/21 1110   ? ? Clinical Impression Statement Pt is progressing towards goals with improved handwriting today.   ? OT Occupational Profile and History Problem Focused Assessment - Including review of records relating to presenting problem   ? Occupational performance deficits (Please refer to evaluation for details): IADL's;Leisure;ADL's   ? Body Structure / Function / Physical Skills Strength;ADL;Decreased knowledge of use of DME;Dexterity;Balance;UE functional use;IADL;ROM;Coordination;FMC;Sensation   ? Rehab Potential Good   ? Clinical Decision Making Several treatment options, min-mod task modification necessary   ? Comorbidities Affecting Occupational Performance: May have comorbidities impacting occupational performance   ? Modification or Assistance to Complete Evaluation  No modification of tasks or assist necessary to complete eval   ? OT Frequency 2x / week   ? OT Duration 8 weeks   plus evaluation (anticipate only 6 weeks needed)  ? OT Treatment/Interventions Self-care/ADL training;DME and/or AE instruction;Therapeutic activities;Coping strategies training;Therapeutic exercise;Neuromuscular education;Patient/family education;Manual Therapy;Functional Mobility Training;Visual/perceptual remediation/compensation   ? Plan review HEP's prn, continue progress towards STG's   ? Consulted and Agree with Plan of Care Patient   ? ?  ?  ? ?  ? ? ?Patient will benefit from skilled therapeutic  intervention in order to improve the following deficits and impairments:   ?Body Structure / Function /  Physical Skills: Strength, ADL, Decreased knowledge of use of DME, Dexterity, Balance, UE functional use, IADL, ROM, Coordination, FMC, Sensation ?  ?  ? ? ?Visit Diagnosis: ?Other lack of coordination ? ?Muscle weakness (generalized) ? ?Unsteadiness on feet ? ?Other disturbances of skin sensation ? ? ? ?Problem List ?Patient Active Problem List  ? Diagnosis Date Noted  ? History of CVA (cerebrovascular accident) 07/02/2021  ? Vertebral artery disease (HCC) 07/02/2021  ? Obesity (BMI 30-39.9) 07/02/2021  ? Right sided numbness 07/02/2021  ? Right-sided numbness secondary to CVA (cerebral vascular accident) (HCC) 07/01/2021  ? Essential hypertension 06/01/2021  ? Type 2 diabetes mellitus with complication, without long-term current use of insulin (HCC) 06/01/2021  ? Right pontine stroke (HCC) 05/31/2021  ? Hyperglycemia due to type 2 diabetes mellitus (HCC) 06/13/2020  ? Male hypogonadism 06/13/2020  ? Mixed hyperlipidemia 06/13/2020  ? Sciatica 06/13/2020  ? Vitamin D deficiency 06/13/2020  ? Environmental allergies 03/18/2018  ? Recurrent incisional hernia with incarceration 08/05/2012  ? ? Willa Frater?Chenay Nesmith, OT ?07/16/2021, 11:41 AM ? ?[facility] [facility] ?[address] ?[place], [place], [address] ?Phone: [phone]   Fax:  [phone] ? ?Name: Joshua Harrington ?MRN: [id number] ?Date of Birth: May 19, 1960 ? ?Willa FraterAngela Shyniece Scripter, OTR/L ?[facility] ?[address] ?[place], [place]  [address] ?[phone] phone ?[phone] ?07/16/21 11:41 AM ? ? ? ?

## 2021-07-16 NOTE — Therapy (Signed)
OUTPATIENT PHYSICAL THERAPY TREATMENT NOTE   Patient Name: Joshua Harrington MRN: [id number] DOB:05-Oct-1960, 61 y.o., male Today's Date: 07/16/2021  PCP: [name], MD REFERRING PROVIDER: [name], MD     Past Medical History:  Diagnosis Date   Diabetes mellitus without complication (HCC)    Hernia    High cholesterol    Hypertension    Past Surgical History:  Procedure Laterality Date   ABDOMINAL SURGERY     HERNIA REPAIR     IR ANGIO INTRA EXTRACRAN SEL COM CAROTID INNOMINATE BILAT MOD SED  07/02/2021   IR ANGIO VERTEBRAL SEL VERTEBRAL UNI L MOD SED  07/02/2021   IR ANGIO VERTEBRAL SEL VERTEBRAL UNI R MOD SED  07/02/2021   IR US GUIDE VASC ACCESS RIGHT  07/02/2021   VASECTOMY     Patient Active Problem List   Diagnosis Date Noted   History of CVA (cerebrovascular accident) 07/02/2021   Vertebral artery disease (HCC) 07/02/2021   Obesity (BMI 30-39.9) 07/02/2021   Right sided numbness 07/02/2021   Right-sided numbness secondary to CVA (cerebral vascular accident) (HCC) 07/01/2021   Essential hypertension 06/01/2021   Type 2 diabetes mellitus with complication, without long-term current use of insulin (HCC) 06/01/2021   Right pontine stroke (HCC) 05/31/2021   Hyperglycemia due to type 2 diabetes mellitus (HCC) 06/13/2020   Male hypogonadism 06/13/2020   Mixed hyperlipidemia 06/13/2020   Sciatica 06/13/2020   Vitamin D deficiency 06/13/2020   Environmental allergies 03/18/2018   Recurrent incisional hernia with incarceration 08/05/2012    REFERRING DIAG: R20.0 (ICD-10-CM) - Right sided numbness    THERAPY DIAG:  No diagnosis found.  PERTINENT HISTORY: DM2, Hyperlipidemia, HTN   PRECAUTIONS: Fall  SUBJECTIVE:  Pt states he has had no issues with HEP, no falls, and no changes since last PT visit.  PAIN:  Are you having pain? Yes-left hip today for first time especially when sitting for prolonged periods or finishes walking.   3/10  VITALS -Pre-session BP taken in LUE in seated position: 136/78 mmHg   OBJECTIVE:  TREATMENT (07/16/2021) Gait training:   Step-through, level surface, indoor, Pt demo dec L foot clearance and mildly antalgic gait of LLE today.  Trialed 46' w/ large base quad cane on R.  Pt places inc UE weight-bearing on quad cane on R, but demos inc concentration on AD use w/ dec gait speed, but improved L foot clearance.  Trial SPC w/ quad tip on L & R for 115' w/ pt demonstrating inc ataxia w/ cane placement when used on L.  Pt demos inc UE weight bearing and slow gait speed with SPC w/ quad tip as well.  Completes 1 lap w/o AD with improved stability and foot clearance. Neuro Re-ed: Pt performs forwards, lateral, and backwards stepping on red and blue mats to practice surface transitions.  PT cued for improved left foot clearance and placement to prevent dragging across mat surface.  No LOB, CGA. Pt performs forward stepping over mat surfaces then 4" hurdles followed by wide stepping to alternating colored dots to promote widened BOS and coordination with steps.  Pt requires MinA to prevent LOB to R posterolateral direction during step over hurdles. Pt performs STS w/ progressively decreased UE support until no UE support with blue folded over mat under bilateral LE.  Pt cued to scoot to EOM prior to forward lean to dec use of legs against back of mat.  Pt requires multiple attempts during initial 5 reps.  Cued for sequencing particularly of  raising the trunk during rising to standing to prevent posterior LOB as pt pushes legs into extension during rise.  Tactile cuing to maintain knee bend and forward lean into controlled lower.  Pt required no cuing for last 2 reps of 10. [facility] tap to blue and red cone.  He requires Min-ModA for maintaining upright during task.  Unable to progress to tapping 2 cones without returning foot to support surface.           TREATMENT (Last visit date3/12/2021)   Implemented and demonstrated initial HEP (see below)    GAIT TRAINING: Gait pattern: step through pattern, decreased step length- Right, ataxic, and poor foot clearance- Right Distance walked: 230' Assistive device utilized: None Level of assistance: CGA Comments: Noted lateral gait deviations (to L side) and single posterior LOB episode w/turn requiring min A to correct.   Gait pattern: step to pattern, decreased arm swing- Left, decreased step length- Right, decreased step length- Left, decreased stride length, lateral lean- Right, and poor foot clearance- Right Distance walked: 230' Assistive device utilized: Single point cane w/quad tip Level of assistance: CGA Comments: Noted decreased cadence compared to gait without AD, increased posterolateral instability, increased lateral gait deviations to L side. Pt advised not to use cane at this time.   NMR -Alt. Taps to multi-colored dots for BLE coordination, single leg stability and visual scanning, x30 taps total. Min guard throughout for steadying assist. Noted significant instability when tapping to R side and difficulty finding dots on L side. Started w/calling out one color at a time and progressed to 3 at a time for increased dual-task challenge.     PATIENT EDUCATION: Education details: Safe setup of HEP at home, gait deviations noted with SPC vs no AD, advised pt not to use SPC at this time Person educated: Patient Education method: Explanation and handout Education comprehension: verbalized understanding     HOME EXERCISE PROGRAM: Access Code: [email or web address] URL: [email or web address] Date: 07/11/2021 Prepared by: Alethia BertholdJannah Plaster  Exercises Side Stepping with Resistance at Thighs and Counter Support - 1 x daily - 7 x weekly - 3 sets - 10 reps Seated March with Resistance - 1 x daily - 7 x weekly - 3 sets - 10 reps - 2s hold Sit to Stand with Resistance Around Legs - 1 x daily -  7 x weekly - 3 sets - 10 reps Forward Backward Monster Walk with Band at Thighs and Counter Support - 1 x daily - 7 x weekly - 3 sets - 10 reps    ASSESSMENT:   CLINICAL IMPRESSION: Pt continues to demonstrate difficulty with use of AD vs no AD during gait training in session.  He continues to demonstrate mild ataxia of bilateral LE, today L>R.  Focused remainder of session on challenging pt with high level balance activities to counter mild posterior trunk sway during movement.  He is doing well with therapy, progress per POC as tolerated.     OBJECTIVE IMPAIRMENTS Abnormal gait, decreased activity tolerance, decreased balance, decreased coordination, decreased endurance, decreased knowledge of condition, decreased knowledge of use of DME, decreased mobility, difficulty walking, decreased ROM, decreased strength, decreased safety awareness, and impaired perceived functional ability.    ACTIVITY LIMITATIONS community activity, driving, occupation, Pharmacologistlaundry, yard work, and shopping.    PERSONAL FACTORS Fitness, Past/current experiences, Time since onset of injury/illness/exacerbation, Transportation, and 1-2 comorbidities: DM2, Hyperlipidemia, HTN  are also affecting patient's functional outcome.  GOALS: Goals reviewed with patient? Yes   SHORT TERM GOALS:   Pt will decrease 5xSTS to <16 seconds in order to demonstrate decreased risk for falls and improved functional bilateral LE strength and power. Baseline: 19.66 sec Target date:  08/10/2021 Goal status: INITIAL   2.  Pt will demonstrate TUG of <13 seconds in order to decrease risk of falls and improve functional mobility using LRAD. Baseline: 15.35 sec Target date:  08/10/2021 Goal status: INITIAL   3. Pt will trial quad cane with gait training in PT and PT to obtain order from MD as necessary to promote safety. Baseline: Pt walks without AD, unsteady requiring CGA to prevent R buckle. Target date:  08/10/2021 Goal status:  INITIAL   4.  Pt will be independent with strength and balance HEP. Baseline: Pt does not have available supervision for exercise at home.  HEP to be initiated next session. Target date:  08/10/2021 Goal status: INITIAL       LONG TERM GOALS:   Pt will demonstrate improved R attention and scanning with use of LRAD for safety with gait.  Baseline: Pt uses no AD, mild R inattention, difficulty navigating obstacles on R Target date:  09/07/2021 Goal status: INITIAL   2.  Pt will ambulate >800' feet with LRAD and SBA level of assist to promote household and community access.  Baseline: 09/07/2021 Target date:  09/07/2021 Goal status: INITIAL   3.  Pt will increase BERG balance score to >/=40/56 to demonstrate improved static balance. Baseline: 31/56= high fall risk Target date:  09/07/2021 Goal status: INITIAL   4.  Pt will demonstrate a gait speed of >1.75 feet/sec at normal speed in order to decrease risk for falls. Baseline: normal speed 1.24 ft/sec Target date:  09/07/2021 Goal status: INITIAL   5.  Pt will increase FOTO score to 76% in order to demonstrate subjective functional improvement.  Baseline: 58% Target date:  09/07/2021 Goal status: INITIAL     PLAN: PT FREQUENCY: 1x/week   PT DURATION: 8 weeks   PLANNED INTERVENTIONS: Therapeutic exercises, Therapeutic activity, Neuromuscular re-education, Balance training, Gait training, Patient/Family education, Joint mobilization, Stair training, Vestibular training, and DME instructions   PLAN FOR NEXT SESSION: How is HEP? Update/review prn, static and dynamic balance, functional activities, obstacle course, visual scanning tasks, coordination tasks, SLS     Sadie Haber, PT, DPT 07/16/2021, 11:54 AM

## 2021-07-16 NOTE — Telephone Encounter (Signed)
? ?  Telephone encounter was:  Successful.  ?07/16/2021 ?Name: Joshua Harrington MRN: [id number] DOB: August 06, 1960 ? ?Joshua Harrington is a 61 y.o. year old male who is a primary care patient of Osei-Bonsu, Greggory Stallion, MD . The community resource team was consulted for assistance with Transportation Needs  ? ?Care guide performed the following interventions: Follow up call placed to community resources to determine status of patients referral. ? ?Talked with ben at transportation , let patient know they will schedule his pick up at the office and any addional trips through the practice . ? ? ?Follow Up Plan:  No further follow up planned at this time. The patient has been provided with needed resources. ? ?Joshua Harrington ?Care Guide , Embedded Care Coordination ?[facility], Care Management  ?[phone] ?[address] [place] , [place] [address] ?Email : [email or web address] ?  ? ?

## 2021-07-18 ENCOUNTER — Ambulatory Visit: Payer: Commercial Managed Care - HMO | Admitting: Occupational Therapy

## 2021-07-18 ENCOUNTER — Telehealth: Payer: Self-pay | Admitting: *Deleted

## 2021-07-18 NOTE — Telephone Encounter (Signed)
? ?  Telephone encounter was:  Successful.  ?07/18/2021 ?Name: Jamond Neels MRN: [id number] DOB: 1960/08/04 ? ?Dacari Beckstrand is a 61 y.o. year old male who is a primary care patient of Osei-Bonsu, Greggory Stallion, MD . The community resource team was consulted for assistance with Transportation Needs  ? ?Care guide performed the following interventions: I have made transportation arrangements for Mr. Herter for March 20th and the 22nd.  His pick up times will be 11:46am and 1:16pm respectively.  When Mr. Friend is done with his appointment, please have him call [phone] and I will schedule a return ride home for him.  He will need to be located at the main entrance to the rehabilitation clinic since that is where his Uber/Lyft will pick him up.  I will give him the estimated time the driver will be there when he calls me so he will know when to expect the driver.  I will also give him the make and model of the vehicle as well as the driver?s name so he can verify that he is getting in the right vehicle.  If he  has any questions, please have him give me a call. ? ? ?Follow Up Plan:  No further follow up planned at this time. The patient has been provided with needed resources. ?[name] ?Care Guide , Embedded Care Coordination ?[facility], Care Management  ?[phone] ?[address] [place] , [place] [address] ?Email : [email or web address] ?  ? ?

## 2021-07-20 ENCOUNTER — Other Ambulatory Visit: Payer: Self-pay | Admitting: *Deleted

## 2021-07-20 NOTE — Patient Outreach (Signed)
[facility] Customer service manager [facility]) Care Management ? ?07/20/2021 ? ?Roberta Nofziger ?Mar 01, 1961 ?[id number] ? ? ?Outgoing call placed to member to follow up on stroke recovery.  He report he is doing well, improving and becoming more independent with the help of outpatient PT and OT.  Several scheduled therapy appointments coming up in the next few weeks, office visit with neurology on 3/27.  Report he was seen by PCP in the last couple weeks, no issues reported.  Denies any urgent concerns, encouraged to contact this care manager with questions.  Will close case at this time as no further needs identified. ? ?Kemper Durie, RN, MSN, CCM ?[facility] Care Management  ?Community Care Manager ?[phone] ? ?

## 2021-07-23 ENCOUNTER — Ambulatory Visit: Payer: Commercial Managed Care - HMO | Admitting: Physical Therapy

## 2021-07-23 ENCOUNTER — Encounter: Payer: Self-pay | Admitting: Physical Therapy

## 2021-07-23 ENCOUNTER — Other Ambulatory Visit: Payer: Self-pay

## 2021-07-23 ENCOUNTER — Ambulatory Visit: Payer: Commercial Managed Care - HMO | Admitting: Occupational Therapy

## 2021-07-23 DIAGNOSIS — R2689 Other abnormalities of gait and mobility: Secondary | ICD-10-CM | POA: Diagnosis not present

## 2021-07-23 DIAGNOSIS — R2681 Unsteadiness on feet: Secondary | ICD-10-CM

## 2021-07-23 DIAGNOSIS — M6281 Muscle weakness (generalized): Secondary | ICD-10-CM

## 2021-07-23 DIAGNOSIS — R278 Other lack of coordination: Secondary | ICD-10-CM

## 2021-07-23 NOTE — Therapy (Signed)
?OUTPATIENT PHYSICAL THERAPY TREATMENT NOTE ? ? ?Patient Name: Joshua Harrington ?MRN: [id number] ?DOB:June 19, 1960, 60 y.o., male ?Today's Date: 07/23/2021 ? ?PCP: [name], MD ?REFERRING PROVIDER: [name], MD ? ? PT End of Session - 07/23/21 1317   ? ? Visit Number 4   ? Number of Visits 9   8+eval  ? Date for PT Re-Evaluation 09/07/21   ? Authorization Type Cigna 2023-VL: 30 combined (PT&OT) same day = 1 visit   ? PT Start Time 1315   ? PT Stop Time 1358   ? PT Time Calculation (min) 43 min   ? Equipment Utilized During Treatment Gait belt   ? Activity Tolerance Patient tolerated treatment well   ? Behavior During Therapy [facility] for tasks assessed/performed   ? ?  ?  ? ?  ? ? ? ?Past Medical History:  ?Diagnosis Date  ? Diabetes mellitus without complication (HCC)   ? Hernia   ? High cholesterol   ? Hypertension   ? ?Past Surgical History:  ?Procedure Laterality Date  ? ABDOMINAL SURGERY    ? HERNIA REPAIR    ? IR ANGIO INTRA EXTRACRAN SEL COM CAROTID INNOMINATE BILAT MOD SED  07/02/2021  ? IR ANGIO VERTEBRAL SEL VERTEBRAL UNI L MOD SED  07/02/2021  ? IR ANGIO VERTEBRAL SEL VERTEBRAL UNI R MOD SED  07/02/2021  ? IR US GUIDE VASC ACCESS RIGHT  07/02/2021  ? VASECTOMY    ? ?Patient Active Problem List  ? Diagnosis Date Noted  ? History of CVA (cerebrovascular accident) 07/02/2021  ? Vertebral artery disease (HCC) 07/02/2021  ? Obesity (BMI 30-39.9) 07/02/2021  ? Right sided numbness 07/02/2021  ? Right-sided numbness secondary to CVA (cerebral vascular accident) (HCC) 07/01/2021  ? Essential hypertension 06/01/2021  ? Type 2 diabetes mellitus with complication, without long-term current use of insulin (HCC) 06/01/2021  ? Right pontine stroke (HCC) 05/31/2021  ? Hyperglycemia due to type 2 diabetes mellitus (HCC) 06/13/2020  ? Male hypogonadism 06/13/2020  ? Mixed hyperlipidemia 06/13/2020  ? Sciatica 06/13/2020  ? Vitamin D deficiency 06/13/2020  ? Environmental allergies 03/18/2018  ? Recurrent incisional  hernia with incarceration 08/05/2012  ? ? ?REFERRING DIAG: R20.0 (ICD-10-CM) - Right sided numbness   ? ?THERAPY DIAG:  ?Other lack of coordination ? ?Muscle weakness (generalized) ? ?Unsteadiness on feet ? ?PERTINENT HISTORY: DM2, Hyperlipidemia, HTN  ? ?PRECAUTIONS: Fall ? ?SUBJECTIVE:  He has some soreness from doing HEP regularly, but feels he is walking and rolling over in bed better. ? ?PAIN:  ?Are you having pain? Yes-left hip soreness.  3/10 ? ?Vitals:  BP taken in sitting on RUE:  122/76 ? ?OBJECTIVE:  ?TREATMENT: ?Mini squats 2x8 with countertop support-pt initiated deep squat prior to therapist instruction and pt was unable to recover losing balance posteriorly landing on buttocks.  Pt coached through use of chair with CGA to rise to standing from floor.  Assessed BP following.  No reported pain or visible injury.  Pt uninjured due to fall.  Able to continue session.   ? ?STS w/ forward reach to chair 2x6, pt demo uneven weight distribution alt R and L w/ good correction with tactile cuing around waist. ? ?Pt performs obstacle course with red mat using 4" hurdles for SLS challenge.  Progressed to hurdles on and off mat with turn navigation using narrowed BOS vs tandem walking, cued for tandem walk between cones using CGA-minA to prevent LOB. ? ?Squats at [facility] using beige weighted ball w/ forward reach/chest press  x8; cued for appropriate depth of squat. ? ?Ended session with SciFit using BUE/BLE x8 mins, warmup at L2 for 2 mins with x2 mins L2.6>x73mins L3; pt avgs 70 steps per min, HR 100 following activity.  Good engagement of LE, min cuing. ?  ?PATIENT EDUCATION: ?Education details: Pt edu on fall recovery using 2 chairs and knees to come to standing.  Pt edu on importance of aerobic exercise and setup and use of seated stepper pt already has available at home. ?Person educated: Patient ?Education method: Explanation ?Education comprehension: verbalized understanding ?  ?  ?HOME EXERCISE PROGRAM: ?Access  Code: [email or web address] ?URL: [email or web address] ?Date: 07/11/2021 ?Prepared by: Alethia Berthold Plaster ? ?Exercises ?Side Stepping with Resistance at Thighs and Counter Support - 1 x daily - 7 x weekly - 3 sets - 10 reps ?Seated March with Resistance - 1 x daily - 7 x weekly - 3 sets - 10 reps - 2s hold ?Sit to Stand with Resistance Around Legs - 1 x daily - 7 x weekly - 3 sets - 10 reps ?Forward Backward Monster Walk with Band at [employer] - 1 x daily - 7 x weekly - 3 sets - 10 reps ? ?  ?ASSESSMENT: ?  ?CLINICAL IMPRESSION: ?Pt mildly impulsive during onset of session resulting in LOB and subsequent fall during squatting activity.  Pt able to continue session after evaluation progressing to focus on aerobic endurance and high level dynamic challenge.  Pt is ambulating with improved coordination during session today, but continues to require cuing for stable BOS and weight shift with SLS activity.  Continue per POC. ?   ?OBJECTIVE IMPAIRMENTS Abnormal gait, decreased activity tolerance, decreased balance, decreased coordination, decreased endurance, decreased knowledge of condition, decreased knowledge of use of DME, decreased mobility, difficulty walking, decreased ROM, decreased strength, decreased safety awareness, and impaired perceived functional ability.  ?  ?ACTIVITY LIMITATIONS community activity, driving, occupation, Pharmacologist, yard work, and shopping.  ?  ?PERSONAL FACTORS Fitness, Past/current experiences, Time since onset of injury/illness/exacerbation, Transportation, and 1-2 comorbidities: DM2, Hyperlipidemia, HTN  are also affecting patient's functional outcome.  ?  ?  ?  ?GOALS: ?Goals reviewed with patient? Yes ?  ?SHORT TERM GOALS: ?  ?Pt will decrease 5xSTS to <16 seconds in order to demonstrate decreased risk for falls and improved functional bilateral LE strength and power. ?Baseline: 19.66 sec ?Target date:  08/10/2021 ?Goal status: INITIAL ?  ?2.  Pt will demonstrate TUG of <13  seconds in order to decrease risk of falls and improve functional mobility using LRAD. ?Baseline: 15.35 sec ?Target date:  08/10/2021 ?Goal status: INITIAL ?  ?3. Pt will trial quad cane with gait training in PT and PT to obtain order from MD as necessary to promote safety. ?Baseline: Pt walks without AD, unsteady requiring CGA to prevent R buckle. ?Target date:  08/10/2021 ?Goal status: INITIAL ?  ?4.  Pt will be independent with strength and balance HEP. ?Baseline: Pt does not have available supervision for exercise at home.  HEP to be initiated next session. ?Target date:  08/10/2021 ?Goal status: INITIAL ?  ?  ?  ?LONG TERM GOALS: ?  ?Pt will demonstrate improved R attention and scanning with use of LRAD for safety with gait.  ?Baseline: Pt uses no AD, mild R inattention, difficulty navigating obstacles on R ?Target date:  09/07/2021 ?Goal status: INITIAL ?  ?2.  Pt will ambulate >800' feet with LRAD and SBA level of assist to promote household and community access.  ?  Baseline: 09/07/2021 ?Target date:  09/07/2021 ?Goal status: INITIAL ?  ?3.  Pt will increase BERG balance score to >/=40/56 to demonstrate improved static balance. ?Baseline: 31/56= high fall risk ?Target date:  09/07/2021 ?Goal status: INITIAL ?  ?4.  Pt will demonstrate a gait speed of >1.75 feet/sec at normal speed in order to decrease risk for falls. ?Baseline: normal speed 1.24 ft/sec ?Target date:  09/07/2021 ?Goal status: INITIAL ?  ?5.  Pt will increase FOTO score to 76% in order to demonstrate subjective functional improvement.  ?Baseline: 58% ?Target date:  09/07/2021 ?Goal status: INITIAL ?  ?  ?PLAN: ?PT FREQUENCY: 1x/week ?  ?PT DURATION: 8 weeks ?  ?PLANNED INTERVENTIONS: Therapeutic exercises, Therapeutic activity, Neuromuscular re-education, Balance training, Gait training, Patient/Family education, Joint mobilization, Stair training, Vestibular training, and DME instructions ?  ?PLAN FOR NEXT SESSION: Check in following fall from last session.   Update/review HEP prn, static and dynamic balance, functional activities, obstacle course, visual scanning tasks, coordination tasks, SLS  ? ? ? ?Sadie HaberMarissa B Ionna Avis, PT, DPT ?07/23/2021, 5:01 PM ? ?   ?

## 2021-07-23 NOTE — Therapy (Signed)
[facility] ?[place] ?[place], [place], [address] ?Phone: [phone]   Fax:  [phone] ? ?Occupational Therapy Treatment ? ?Patient Details  ?Name: Joshua Harrington ?MRN: [id number] ?Date of Birth: November 30, 1960 ?Referring Provider (OT): Dr. Bernadette Hoit (PCP) ? ? ?Encounter Date: 07/23/2021 ? ? OT End of Session - 07/23/21 1225   ? ? Visit Number 4   ? Number of Visits 17   ? Date for OT Re-Evaluation 09/08/21   ? Authorization Type Cigna/Cigna Managed - 30 combined visits (PT/OT); counts as 1 if seen on same day   ? OT Start Time 1220   ? OT Stop Time 1305   ? OT Time Calculation (min) 45 min   ? Activity Tolerance Patient tolerated treatment well   ? Behavior During Therapy [facility] for tasks assessed/performed   ? ?  ?  ? ?  ? ? ?Past Medical History:  ?Diagnosis Date  ? Diabetes mellitus without complication ([place])   ? Hernia   ? High cholesterol   ? Hypertension   ? ? ?Past Surgical History:  ?Procedure Laterality Date  ? ABDOMINAL SURGERY    ? HERNIA REPAIR    ? IR ANGIO INTRA EXTRACRAN SEL COM CAROTID INNOMINATE BILAT MOD SED  07/02/2021  ? IR ANGIO VERTEBRAL SEL VERTEBRAL UNI L MOD SED  07/02/2021  ? IR ANGIO VERTEBRAL SEL VERTEBRAL UNI R MOD SED  07/02/2021  ? IR US GUIDE VASC ACCESS RIGHT  07/02/2021  ? VASECTOMY    ? ? ?There were no vitals filed for this visit. ? ? Subjective Assessment - 07/23/21 1221   ? ? Subjective  I'm shaving with the Rt hand (electric razor) and writing has gotten better   ? 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 PMH: HLD, HTN, T2DM, h/o CVA (Jan 2023), VAD   ? Limitations Fall risk, ? driving   ? Currently in Pain? No/denies   ? ?  ?  ? ?  ? ?Pt reports he has been practicing his writing at home.  ?Practiced writing 3 sentences in print  with 95% legibility. Pt signed name x 3 w/ 90% legibility. Practiced lower case "L's" in cursive. Completed distal finger control worksheet for fine motor coordination and control.  ? ?Placing  small pegs in pegboard Rt hand while copying peg design with min difficulty (manipulating 3 at a time for placement and removal) and 100% accuracy copying fairly simple peg design ? ?Screwing/unscrewing nuts/bolts w/ vision and w/ vision occluded.  ?Manipulating dii in numerical order b/t first 3 fingers Rt hand ? ?UBE x 8 min. Level 3 for UB strength/endurance ? ?Gripper set at level 3 resistance to pick up blocks Rt hand for sustained grip strength w/ min difficulty/drops ? ? ? ? ? ? ? ? ? ? ? ? ? ? ? ? ? ? ? ? ? ? ? ? ? ? ? ? OT Short Term Goals - 07/23/21 1229   ? ?  ? OT SHORT TERM GOAL #1  ? Title Independent with HEP for coordination and strength   ? Time 4   ? Period Weeks   ? Status Achieved   ?  ? OT SHORT TERM GOAL #2  ? Title Pt to improve coordination Rt hand to 30 sec or less   ? Baseline 38 sec   ? Time 4   ? Period Weeks   ? Status On-going   ?  ? OT SHORT TERM GOAL #3  ? Title  Grip strength Rt hand to be 68 lbs or greater in prep for return to leisure activities (car mechanics)   ? Baseline 62 lbs   ? Time 4   ? Period Weeks   ? Status On-going   ?  ? OT SHORT TERM GOAL #4  ? Title Pt to report greater ease eating with built up utensils prn   ? Time 4   ? Period Weeks   ? Status On-going   ?  ? OT SHORT TERM GOAL #5  ? Title Pt to write name at 90% or greater legibility   ? Baseline 75%   ? Time 4   ? Period Weeks   ? Status Achieved   ? ?  ?  ? ?  ? ? ? ? OT Long Term Goals - 07/23/21 1306   ? ?  ? OT LONG TERM GOAL #1  ? Title Independent with updated HEP prn   ? Time 8   ? Period Weeks   ? Status New   ?  ? OT LONG TERM GOAL #2  ? Title Improve coordination Rt hand as evidenced by performing 9 hole peg test in 26 sec or less   ? Baseline 38 sec   ? Time 8   ? Period Weeks   ? Status New   ?  ? OT LONG TERM GOAL #3  ? Title Pt to write 3 sentences maintaining 90% or greater legibility   ? Time 8   ? Period Weeks   ? Status On-going   ?  ? OT LONG TERM GOAL #4  ? Title Pt to return to leisure  activities (playing guitar, drums, and light mechanics) with increased time prn   ? Time 8   ? Period Weeks   ? Status New   ?  ? OT LONG TERM GOAL #5  ? Title Pt to return to cooking at [facility] safely w/ distant supervision   ? Time 8   ? Period Weeks   ? Status New   ?  ? OT LONG TERM GOAL #6  ? Title Pt to perform environmental scanning in busy gym w/ simple physical task at 85% or greater accuracy   ? Time 8   ? Period Weeks   ? Status New   ? ?  ?  ? ?  ? ? ? ? ? ? ? ? Plan - 07/23/21 1234   ? ? Clinical Impression Statement Pt is progressing towards goals with improved handwriting today. Pt has already met 2 STG's and progressing closely towards remaining STG's   ? OT Occupational Profile and History Problem Focused Assessment - Including review of records relating to presenting problem   ? Occupational performance deficits (Please refer to evaluation for details): IADL's;Leisure;ADL's   ? Body Structure / Function / Physical Skills Strength;ADL;Decreased knowledge of use of DME;Dexterity;Balance;UE functional use;IADL;ROM;Coordination;FMC;Sensation   ? Rehab Potential Good   ? Clinical Decision Making Several treatment options, min-mod task modification necessary   ? Comorbidities Affecting Occupational Performance: May have comorbidities impacting occupational performance   ? Modification or Assistance to Complete Evaluation  No modification of tasks or assist necessary to complete eval   ? OT Frequency 2x / week   ? OT Duration 8 weeks   plus evaluation (anticipate only 6 weeks needed)  ? OT Treatment/Interventions Self-care/ADL training;DME and/or AE instruction;Therapeutic activities;Coping strategies training;Therapeutic exercise;Neuromuscular education;Patient/family education;Manual Therapy;Functional Mobility Training;Visual/perceptual remediation/compensation   ? Plan Assess remaining STG's, practice environmental scanning  in prep for return to driving  ? Consulted and Agree with Plan of Care Patient    ? ?  ?  ? ?  ? ? ?Patient will benefit from skilled therapeutic intervention in order to improve the following deficits and impairments:   ?Body Structure / Function / Physical Skills: Strength, ADL, Decreased knowledge of use of DME, Dexterity, Balance, UE functional use, IADL, ROM, Coordination, FMC, Sensation ?  ?  ? ? ?Visit Diagnosis: ?Other lack of coordination ? ?Muscle weakness (generalized) ? ? ? ?Problem List ?Patient Active Problem List  ? Diagnosis Date Noted  ? History of CVA (cerebrovascular accident) 07/02/2021  ? Vertebral artery disease ([place]) 07/02/2021  ? Obesity (BMI 30-39.9) 07/02/2021  ? Right sided numbness 07/02/2021  ? Right-sided numbness secondary to CVA (cerebral vascular accident) ([place]) 07/01/2021  ? Essential hypertension 06/01/2021  ? Type 2 diabetes mellitus with complication, without long-term current use of insulin ([place]) 06/01/2021  ? Right pontine stroke ([place]) 05/31/2021  ? Hyperglycemia due to type 2 diabetes mellitus ([place]) 06/13/2020  ? Male hypogonadism 06/13/2020  ? Mixed hyperlipidemia 06/13/2020  ? Sciatica 06/13/2020  ? Vitamin D deficiency 06/13/2020  ? Environmental allergies 03/18/2018  ? Recurrent incisional hernia with incarceration 08/05/2012  ? ? ?Carey Bullocks, OTR/L ?07/23/2021, 1:07 PM ? ?[facility] ?[place] ?[place], [place], [address] ?Phone: [phone]   Fax:  [phone] ? ?Name: Joshua Harrington ?MRN: [id number] ?Date of Birth: Sep 12, 1960 ? ?

## 2021-07-25 ENCOUNTER — Other Ambulatory Visit: Payer: Self-pay

## 2021-07-25 ENCOUNTER — Ambulatory Visit: Payer: Commercial Managed Care - HMO | Admitting: Occupational Therapy

## 2021-07-25 DIAGNOSIS — R278 Other lack of coordination: Secondary | ICD-10-CM

## 2021-07-25 DIAGNOSIS — R2689 Other abnormalities of gait and mobility: Secondary | ICD-10-CM | POA: Diagnosis not present

## 2021-07-25 DIAGNOSIS — M6281 Muscle weakness (generalized): Secondary | ICD-10-CM

## 2021-07-25 NOTE — Therapy (Signed)
[facility] ?[place] ?[place], [place], [address] ?Phone: [phone]   Fax:  [phone] ? ?Occupational Therapy Treatment ? ?Patient Details  ?Name: Joshua Harrington ?MRN: [id number] ?Date of Birth: 1961/04/01 ?Referring Provider (OT): Dr. Bernadette Hoit (PCP) ? ? ?Encounter Date: 07/25/2021 ? ? OT End of Session - 07/25/21 1447   ? ? Visit Number 5   ? Number of Visits 17   ? Date for OT Re-Evaluation 09/08/21   ? Authorization Type Cigna/Cigna Managed - 30 combined visits (PT/OT); counts as 1 if seen on same day   ? OT Start Time 1400   ? OT Stop Time 1440   ? OT Time Calculation (min) 40 min   ? Activity Tolerance Patient tolerated treatment well   ? Behavior During Therapy [facility] for tasks assessed/performed   ? ?  ?  ? ?  ? ? ?Past Medical History:  ?Diagnosis Date  ? Diabetes mellitus without complication ([place])   ? Hernia   ? High cholesterol   ? Hypertension   ? ? ?Past Surgical History:  ?Procedure Laterality Date  ? ABDOMINAL SURGERY    ? HERNIA REPAIR    ? IR ANGIO INTRA EXTRACRAN SEL COM CAROTID INNOMINATE BILAT MOD SED  07/02/2021  ? IR ANGIO VERTEBRAL SEL VERTEBRAL UNI L MOD SED  07/02/2021  ? IR ANGIO VERTEBRAL SEL VERTEBRAL UNI R MOD SED  07/02/2021  ? IR US GUIDE VASC ACCESS RIGHT  07/02/2021  ? VASECTOMY    ? ? ?There were no vitals filed for this visit. ? ? Subjective Assessment - 07/25/21 1403   ? ? Subjective  I've been practicing writing. I feel fine after the fall w/ P.T. last session   ? 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 PMH: HLD, HTN, T2DM, h/o CVA (Jan 2023), VAD   ? Limitations Fall risk, ? driving   ? Currently in Pain? No/denies   ? ?  ?  ? ?  ? ? ?Assessed STG's and progress to date:  ? ?9 hole peg test Rt = 25.25 sec ?Grip strength Rt = 84.2 lbs ? ? ?Pt copying simple PVC pipe design with initial set up of pieces and cues - pt followed design mostly correct however had a difficult time using the correct length pipe pieces. Pt had  same difficulty and cues needed to correct for next 2 more complex designs.  ? ?Environmental scanning while tossing ball Rt hand (gait belt and CGA for balance) finding 11/14 items on first pass. Pt missed 2 on Lt and 1 on Rt. Pt found remaining 3 items on 2nd pass.  ? ? ? ? ? ? ? ? ? ? ? ? ? ? ? ? ? ? ? ? ? OT Short Term Goals - 07/25/21 1408   ? ?  ? OT SHORT TERM GOAL #1  ? Title Independent with HEP for coordination and strength   ? Time 4   ? Period Weeks   ? Status Achieved   ?  ? OT SHORT TERM GOAL #2  ? Title Pt to improve coordination Rt hand to 30 sec or less   ? Baseline 38 sec   ? Time 4   ? Period Weeks   ? Status Achieved   25.25 sec  ?  ? OT SHORT TERM GOAL #3  ? 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 Baseline  62 lbs   ? Time 4   ? Period Weeks   ? Status Achieved   84 lbs  ?  ? OT SHORT TERM GOAL #4  ? Title Pt to report greater ease eating with built up utensils prn   ? Time 4   ? Period Weeks   ? Status Achieved   ?  ? OT SHORT TERM GOAL #5  ? Title Pt to write name at 90% or greater legibility   ? Baseline 75%   ? Time 4   ? Period Weeks   ? Status Achieved   ? ?  ?  ? ?  ? ? ? ? OT Long Term Goals - 07/25/21 1409   ? ?  ? OT LONG TERM GOAL #1  ? Title Independent with updated HEP prn   ? Time 8   ? Period Weeks   ? Status New   ?  ? OT LONG TERM GOAL #2  ? Title Improve coordination Rt hand as evidenced by performing 9 hole peg test in 22 sec or less   ? Baseline 38 sec   ? Time 8   ? Period Weeks   ? Status Revised   25.25 sec  ?  ? OT LONG TERM GOAL #3  ? Title Pt to write 3 sentences maintaining 90% or greater legibility   ? Time 8   ? Period Weeks   ? Status On-going   ?  ? OT LONG TERM GOAL #4  ? Title Pt to return to leisure activities (playing guitar, drums, and light mechanics) with increased time prn   ? Time 8   ? Period Weeks   ? Status New   ?  ? OT LONG TERM GOAL #5  ? Title Pt to return to cooking at [facility]  safely w/ distant supervision   ? Time 8   ? Period Weeks   ? Status New   ?  ? OT LONG TERM GOAL #6  ? Title Pt to perform environmental scanning in busy gym w/ simple physical task at 85% or greater accuracy   ? Time 8   ? Period Weeks   ? Status On-going   11/14  ? ?  ?  ? ?  ? ? ? ? ? ? ? ? Plan - 07/25/21 1448   ? ? Clinical Impression Statement Pt has met all STG's at this time. Pt progressing towards LTG's   ? OT Occupational Profile and History Problem Focused Assessment - Including review of records relating to presenting problem   ? Occupational performance deficits (Please refer to evaluation for details): IADL's;Leisure;ADL's   ? Body Structure / Function / Physical Skills Strength;ADL;Decreased knowledge of use of DME;Dexterity;Balance;UE functional use;IADL;ROM;Coordination;FMC;Sensation   ? Rehab Potential Good   ? Clinical Decision Making Several treatment options, min-mod task modification necessary   ? Comorbidities Affecting Occupational Performance: May have comorbidities impacting occupational performance   ? Modification or Assistance to Complete Evaluation  No modification of tasks or assist necessary to complete eval   ? OT Frequency 2x / week   ? OT Duration 8 weeks   plus evaluation (anticipate only 6 weeks needed)  ? OT Treatment/Interventions Self-care/ADL training;DME and/or AE instruction;Therapeutic activities;Coping strategies training;Therapeutic exercise;Neuromuscular education;Patient/family education;Manual Therapy;Functional Mobility Training;Visual/perceptual remediation/compensation   ? Plan continue coordination RUE, dynamic standing, environmental scanning   ? Consulted and Agree with Plan of Care Patient   ? ?  ?  ? ?  ? ? ?  Patient will benefit from skilled therapeutic intervention in order to improve the following deficits and impairments:   ?Body Structure / Function / Physical Skills: Strength, ADL, Decreased knowledge of use of DME, Dexterity, Balance, UE functional use,  IADL, ROM, Coordination, FMC, Sensation ?  ?  ? ? ?Visit Diagnosis: ?Other lack of coordination ? ?Muscle weakness (generalized) ? ? ? ?Problem List ?Patient Active Problem List  ? Diagnosis Date Noted  ? History of CVA (cerebrovascular accident) 07/02/2021  ? Vertebral artery disease ([place]) 07/02/2021  ? Obesity (BMI 30-39.9) 07/02/2021  ? Right sided numbness 07/02/2021  ? Right-sided numbness secondary to CVA (cerebral vascular accident) ([place]) 07/01/2021  ? Essential hypertension 06/01/2021  ? Type 2 diabetes mellitus with complication, without long-term current use of insulin ([place]) 06/01/2021  ? Right pontine stroke ([place]) 05/31/2021  ? Hyperglycemia due to type 2 diabetes mellitus ([place]) 06/13/2020  ? Male hypogonadism 06/13/2020  ? Mixed hyperlipidemia 06/13/2020  ? Sciatica 06/13/2020  ? Vitamin D deficiency 06/13/2020  ? Environmental allergies 03/18/2018  ? Recurrent incisional hernia with incarceration 08/05/2012  ? ? ?Carey Bullocks, OTR/L ?07/25/2021, 2:49 PM ? ?[place] ?[place] ?[place], [place], [address] ?Phone: [phone]   Fax:  [phone] ? ?Name: Joshua Harrington ?MRN: [id number] ?Date of Birth: 1960-07-31 ? ?

## 2021-07-30 ENCOUNTER — Ambulatory Visit: Payer: Commercial Managed Care - HMO | Admitting: Physical Therapy

## 2021-07-30 ENCOUNTER — Other Ambulatory Visit: Payer: Self-pay

## 2021-07-30 ENCOUNTER — Ambulatory Visit: Payer: Commercial Managed Care - HMO | Admitting: Occupational Therapy

## 2021-07-30 ENCOUNTER — Encounter: Payer: Self-pay | Admitting: Physical Therapy

## 2021-07-30 ENCOUNTER — Encounter: Payer: Self-pay | Admitting: Occupational Therapy

## 2021-07-30 DIAGNOSIS — M6281 Muscle weakness (generalized): Secondary | ICD-10-CM

## 2021-07-30 DIAGNOSIS — R2681 Unsteadiness on feet: Secondary | ICD-10-CM

## 2021-07-30 DIAGNOSIS — R278 Other lack of coordination: Secondary | ICD-10-CM

## 2021-07-30 DIAGNOSIS — R2689 Other abnormalities of gait and mobility: Secondary | ICD-10-CM | POA: Diagnosis not present

## 2021-07-30 NOTE — Therapy (Signed)
[place] [facility] ?[place], [place], [address] ?Phone: [phone]   Fax:  [phone] ? ?Occupational Therapy Treatment ? ?Patient Details  ?Name: Joshua Harrington ?MRN: [id number] ?Date of Birth: 1960/06/24 ?Referring Provider (OT): Dr. Bernadette Hoit (PCP) ? ? ?Encounter Date: 07/30/2021 ? ? OT End of Session - 07/30/21 1236   ? ? Visit Number 6   ? Number of Visits 17   ? Date for OT Re-Evaluation 09/08/21   ? Authorization Type Cigna/Cigna Managed - 30 combined visits (PT/OT); counts as 1 if seen on same day   ? OT Start Time 1230   ? OT Stop Time 1315   ? OT Time Calculation (min) 45 min   ? Activity Tolerance Patient tolerated treatment well   ? Behavior During Therapy [facility] for tasks assessed/performed   ? ?  ?  ? ?  ? ? ?Past Medical History:  ?Diagnosis Date  ? Diabetes mellitus without complication ([place])   ? Hernia   ? High cholesterol   ? Hypertension   ? ? ?Past Surgical History:  ?Procedure Laterality Date  ? ABDOMINAL SURGERY    ? HERNIA REPAIR    ? IR ANGIO INTRA EXTRACRAN SEL COM CAROTID INNOMINATE BILAT MOD SED  07/02/2021  ? IR ANGIO VERTEBRAL SEL VERTEBRAL UNI L MOD SED  07/02/2021  ? IR ANGIO VERTEBRAL SEL VERTEBRAL UNI R MOD SED  07/02/2021  ? IR US GUIDE VASC ACCESS RIGHT  07/02/2021  ? VASECTOMY    ? ? ?There were no vitals filed for this visit. ? ? Subjective Assessment - 07/30/21 1236   ? ? Subjective  I've been practicing writing. I feel fine after the fall w/ P.T. last session   ? 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 PMH: HLD, HTN, T2DM, h/o CVA (Jan 2023), VAD   ? Limitations Fall risk, ? driving   ? Currently in Pain? Yes   ? Pain Score 3    ? Pain Location --   thigh  ? Pain Orientation Left   ? Pain Descriptors / Indicators Sharp   ? Pain Onset More than a month ago   ? Pain Frequency Intermittent   ? Aggravating Factors  lifting the leg   ? Pain Relieving Factors rest, OTC meds   ? ?  ?  ? ?  ? ? ?Pt retrieving items from lower  cabinets w/ one hand countertop support. Pt also practiced retrieving objects from higher shelf using BUE's while leaning hips into counter.  ?Placing clothes in washer, then dryer, then out of dryer demo good safety then carrying laundry basket around gym x 1 w/o LOB. ? ?Pt placing grooved pegs in pegboard w/ tweezers Rt hand, w/ max difficulty/drops then removing w/ tweezers w/ little to no difficulty.  ? ?Environmental scanning finding 9/15 on first pass (missing 4 on Lt, 2 on Rt). On second pass, found 4 on Lt. On third pass, found 1/2 on Rt.  ? ? ? ? ? ? ? ? ? ? ? ? ? ? ? ? ? ? ? ? ? ? OT Short Term Goals - 07/25/21 1408   ? ?  ? OT SHORT TERM GOAL #1  ? Title Independent with HEP for coordination and strength   ? Time 4   ? Period Weeks   ? Status Achieved   ?  ? OT SHORT TERM GOAL #2  ? Title Pt to improve coordination Rt hand to 30 sec or less   ?  Baseline 38 sec   ? Time 4   ? Period Weeks   ? Status Achieved   25.25 sec  ?  ? OT SHORT TERM GOAL #3  ? 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 Baseline 62 lbs   ? Time 4   ? Period Weeks   ? Status Achieved   84 lbs  ?  ? OT SHORT TERM GOAL #4  ? Title Pt to report greater ease eating with built up utensils prn   ? Time 4   ? Period Weeks   ? Status Achieved   ?  ? OT SHORT TERM GOAL #5  ? Title Pt to write name at 90% or greater legibility   ? Baseline 75%   ? Time 4   ? Period Weeks   ? Status Achieved   ? ?  ?  ? ?  ? ? ? ? OT Long Term Goals - 07/30/21 1248   ? ?  ? OT LONG TERM GOAL #1  ? Title Independent with updated HEP prn   ? Time 8   ? Period Weeks   ? Status New   ?  ? OT LONG TERM GOAL #2  ? Title Improve coordination Rt hand as evidenced by performing 9 hole peg test in 22 sec or less   ? Baseline 38 sec   ? Time 8   ? Period Weeks   ? Status Revised   25.25 sec  ?  ? OT LONG TERM GOAL #3  ? Title Pt to write 3 sentences maintaining 90% or greater legibility   ? Time 8   ?  Period Weeks   ? Status On-going   ?  ? OT LONG TERM GOAL #4  ? Title Pt to return to leisure activities (playing guitar, drums, and light mechanics) with increased time prn   ? Time 8   ? Period Weeks   ? Status On-going   ?  ? OT LONG TERM GOAL #5  ? Title Pt to return to cooking at [facility] safely w/ distant supervision   ? Time 8   ? Period Weeks   ? Status New   ?  ? OT LONG TERM GOAL #6  ? Title Pt to perform environmental scanning in busy gym w/ simple physical task at 85% or greater accuracy   ? Time 8   ? Period Weeks   ? Status On-going   07/25/21: 11/14 on first pass  ? ?  ?  ? ?  ? ? ? ? ? ? ? ? Plan - 07/30/21 1249   ? ? Clinical Impression Statement Pt has met all STG's at this time. Pt progressing towards LTG's   ? OT Occupational Profile and History Problem Focused Assessment - Including review of records relating to presenting problem   ? Occupational performance deficits (Please refer to evaluation for details): IADL's;Leisure;ADL's   ? Body Structure / Function / Physical Skills Strength;ADL;Decreased knowledge of use of DME;Dexterity;Balance;UE functional use;IADL;ROM;Coordination;FMC;Sensation   ? Rehab Potential Good   ? Clinical Decision Making Several treatment options, min-mod task modification necessary   ? Comorbidities Affecting Occupational Performance: May have comorbidities impacting occupational performance   ? Modification or Assistance to Complete Evaluation  No modification of tasks or assist necessary to complete eval   ? OT Frequency 2x / week   ? OT Duration 8 weeks  plus evaluation (anticipate only 6 weeks needed)  ? OT Treatment/Interventions Self-care/ADL training;DME and/or AE instruction;Therapeutic activities;Coping strategies training;Therapeutic exercise;Neuromuscular education;Patient/family education;Manual Therapy;Functional Mobility Training;Visual/perceptual remediation/compensation   ? Plan cooking task   ? Consulted and Agree with Plan of Care Patient   ? ?  ?  ? ?   ? ? ?Patient will benefit from skilled therapeutic intervention in order to improve the following deficits and impairments:   ?Body Structure / Function / Physical Skills: Strength, ADL, Decreased knowledge of use of DME, Dexterity, Balance, UE functional use, IADL, ROM, Coordination, FMC, Sensation ?  ?  ? ? ?Visit Diagnosis: ?Other lack of coordination ? ?Unsteadiness on feet ? ?Muscle weakness (generalized) ? ? ? ?Problem List ?Patient Active Problem List  ? Diagnosis Date Noted  ? History of CVA (cerebrovascular accident) 07/02/2021  ? Vertebral artery disease ([place]) 07/02/2021  ? Obesity (BMI 30-39.9) 07/02/2021  ? Right sided numbness 07/02/2021  ? Right-sided numbness secondary to CVA (cerebral vascular accident) ([place]) 07/01/2021  ? Essential hypertension 06/01/2021  ? Type 2 diabetes mellitus with complication, without long-term current use of insulin ([place]) 06/01/2021  ? Right pontine stroke ([place]) 05/31/2021  ? Hyperglycemia due to type 2 diabetes mellitus ([place]) 06/13/2020  ? Male hypogonadism 06/13/2020  ? Mixed hyperlipidemia 06/13/2020  ? Sciatica 06/13/2020  ? Vitamin D deficiency 06/13/2020  ? Environmental allergies 03/18/2018  ? Recurrent incisional hernia with incarceration 08/05/2012  ? ? ?Carey Bullocks, OTR/L ?07/30/2021, 1:10 PM ? ?[place] ?[place] ?[place], [place], [address] ?Phone: [phone]   Fax:  [phone] ? ?Name: Joshua Harrington ?MRN: [id number] ?Date of Birth: 1961/01/30 ? ?

## 2021-07-30 NOTE — Therapy (Signed)
?OUTPATIENT PHYSICAL THERAPY TREATMENT NOTE ? ? ?Patient Name: Joshua Harrington ?MRN: [id number] ?DOB:Jul 06, 1960, 61 y.o., male ?Today's Date: 07/30/2021 ? ?PCP: [name], MD ?REFERRING PROVIDER: Hosie Poisson, MD ? ? PT End of Session - 07/30/21 1317   ? ? Visit Number 5   ? Number of Visits 9   8+eval  ? Date for PT Re-Evaluation 09/07/21   ? Authorization Type Cigna 2023-VL: 30 combined (PT&OT) same day = 1 visit   ? PT Start Time 1316   ? PT Stop Time 1358   ? PT Time Calculation (min) 42 min   ? Equipment Utilized During Treatment Gait belt   ? Activity Tolerance Patient tolerated treatment well   ? Behavior During Therapy [facility] for tasks assessed/performed   ? ?  ?  ? ?  ? ? ? ?Past Medical History:  ?Diagnosis Date  ? Diabetes mellitus without complication ([place])   ? Hernia   ? High cholesterol   ? Hypertension   ? ?Past Surgical History:  ?Procedure Laterality Date  ? ABDOMINAL SURGERY    ? HERNIA REPAIR    ? IR ANGIO INTRA EXTRACRAN SEL COM CAROTID INNOMINATE BILAT MOD SED  07/02/2021  ? IR ANGIO VERTEBRAL SEL VERTEBRAL UNI L MOD SED  07/02/2021  ? IR ANGIO VERTEBRAL SEL VERTEBRAL UNI R MOD SED  07/02/2021  ? IR US GUIDE VASC ACCESS RIGHT  07/02/2021  ? VASECTOMY    ? ?Patient Active Problem List  ? Diagnosis Date Noted  ? History of CVA (cerebrovascular accident) 07/02/2021  ? Vertebral artery disease ([place]) 07/02/2021  ? Obesity (BMI 30-39.9) 07/02/2021  ? Right sided numbness 07/02/2021  ? Right-sided numbness secondary to CVA (cerebral vascular accident) ([place]) 07/01/2021  ? Essential hypertension 06/01/2021  ? Type 2 diabetes mellitus with complication, without long-term current use of insulin ([place]) 06/01/2021  ? Right pontine stroke ([place]) 05/31/2021  ? Hyperglycemia due to type 2 diabetes mellitus ([place]) 06/13/2020  ? Male hypogonadism 06/13/2020  ? Mixed hyperlipidemia 06/13/2020  ? Sciatica 06/13/2020  ? Vitamin D deficiency 06/13/2020  ? Environmental allergies 03/18/2018  ? Recurrent incisional hernia  with incarceration 08/05/2012  ? ? ?REFERRING DIAG: R20.0 (ICD-10-CM) - Right sided numbness   ? ?THERAPY DIAG:  ?Other lack of coordination ? ?Unsteadiness on feet ? ?Muscle weakness (generalized) ? ?PERTINENT HISTORY: DM2, Hyperlipidemia, HTN  ? ?PRECAUTIONS: Fall ? ?SUBJECTIVE:  He states no falls at home, he is fine from last session fall, and he is moving around better. ? ?PAIN:  ?Are you having pain? Yes-left hip soreness.  3/10 ? ?OBJECTIVE:  ?TREATMENT: ?Initiated session with 53mins on SciFit using BUE for reciprocal movement and strength, L2.5 w/ step goal of 60, achieved 80-90 steps/min. ? ?Circuit training:  pt ambulates 1 lap, 115', followed by STS w/ overhead pressup 3lb x5 each UE, and standing calf raises; circuit completed x2; pt cued to promote ease of STS transfer using forward scoot to EOM.  One step commands used for STS w/ pressup for correct sequence and engagement to task.  Cued for continuity of task and self-monitoring of fatigue. ? ?Pt stands tandem on incline with EO/EC x30 sec>wide-semi-tandem>walking up and down ramp ? ?Pt stands at trampoline w/ 1kg weighted ball performing rebounder with normal BOS> narrow BOS>wide semi-tandem>sideways; emphasis on visual tracking to promote trunk and neck rotation and coordination, cued for core engagement to self-stabilize ? ?Cone retrieval using unilateral basket carry and visual scanning to promote dual tasking. ?  ?PATIENT EDUCATION: ?  Education details: Edu to spouse on pt current functional status and stroke history due to concern about perception of pt presentation during ambulation. ?Person educated: Patient; Wife ?Education method: Explanation ?Education comprehension: verbalized understanding ?  ?  ?HOME EXERCISE PROGRAM: ?Access Code: [email or web address] ?URL: [email or web address] ?Date: 07/11/2021 ?Prepared by: Mickie Bail Plaster ? ?Exercises ?Side Stepping with Resistance at Thighs and Counter Support - 1 x daily - 7 x weekly - 3 sets - 10  reps ?Seated March with Resistance - 1 x daily - 7 x weekly - 3 sets - 10 reps - 2s hold ?Sit to Stand with Resistance Around Legs - 1 x daily - 7 x weekly - 3 sets - 10 reps ?Forward Backward Monster Walk with Band at [employer] and [employer] - 1 x daily - 7 x weekly - 3 sets - 10 reps ? ?  ?ASSESSMENT: ?  ?CLINICAL IMPRESSION: ?Pt demonstrates improved ambulation in gym with better bilateral foot clearance and decreased trunk sway, but remains limited by fatigue with inability to maintain improved mechanics as pt fatigues.  Pt demonstrates inc need for one step commands during circuit training to promote improved mechanics during STS task.  Pt overall is progressing towards goals with some improvement noted in task sequencing and decreased posterior LOB. ?   ?OBJECTIVE IMPAIRMENTS Abnormal gait, decreased activity tolerance, decreased balance, decreased coordination, decreased endurance, decreased knowledge of condition, decreased knowledge of use of DME, decreased mobility, difficulty walking, decreased ROM, decreased strength, decreased safety awareness, and impaired perceived functional ability.  ?  ?ACTIVITY LIMITATIONS community activity, driving, occupation, Medical sales representative, yard work, and shopping.  ?  ?PERSONAL FACTORS Fitness, Past/current experiences, Time since onset of injury/illness/exacerbation, Transportation, and 1-2 comorbidities: DM2, Hyperlipidemia, HTN  are also affecting patient's functional outcome.  ?  ?  ?  ?GOALS: ?Goals reviewed with patient? Yes ?  ?SHORT TERM GOALS: ?  ?Pt will decrease 5xSTS to <16 seconds in order to demonstrate decreased risk for falls and improved functional bilateral LE strength and power. ?Baseline: 19.66 sec ?Target date:  08/10/2021 ?Goal status: INITIAL ?  ?2.  Pt will demonstrate TUG of <13 seconds in order to decrease risk of falls and improve functional mobility using LRAD. ?Baseline: 15.35 sec ?Target date:  08/10/2021 ?Goal status: INITIAL ?  ?3. Pt will trial quad  cane with gait training in PT and PT to obtain order from MD as necessary to promote safety. ?Baseline: Pt walks without AD, unsteady requiring CGA to prevent R buckle. ?Target date:  08/10/2021 ?Goal status: INITIAL ?  ?4.  Pt will be independent with strength and balance HEP. ?Baseline: Pt does not have available supervision for exercise at home.  HEP to be initiated next session. ?Target date:  08/10/2021 ?Goal status: INITIAL ?  ?  ?  ?LONG TERM GOALS: ?  ?Pt will demonstrate improved R attention and scanning with use of LRAD for safety with gait.  ?Baseline: Pt uses no AD, mild R inattention, difficulty navigating obstacles on R ?Target date:  09/07/2021 ?Goal status: INITIAL ?  ?2.  Pt will ambulate >800' feet with LRAD and SBA level of assist to promote household and community access.  ?Baseline: 09/07/2021 ?Target date:  09/07/2021 ?Goal status: INITIAL ?  ?3.  Pt will increase BERG balance score to >/=40/56 to demonstrate improved static balance. ?Baseline: 31/56= high fall risk ?Target date:  09/07/2021 ?Goal status: INITIAL ?  ?4.  Pt will demonstrate a gait speed of >1.75 feet/sec at normal speed in order to  decrease risk for falls. ?Baseline: normal speed 1.24 ft/sec ?Target date:  09/07/2021 ?Goal status: INITIAL ?  ?5.  Pt will increase FOTO score to 76% in order to demonstrate subjective functional improvement.  ?Baseline: 58% ?Target date:  09/07/2021 ?Goal status: INITIAL ?  ?  ?PLAN: ?PT FREQUENCY: 1x/week ?  ?PT DURATION: 8 weeks ?  ?PLANNED INTERVENTIONS: Therapeutic exercises, Therapeutic activity, Neuromuscular re-education, Balance training, Gait training, Patient/Family education, Joint mobilization, Stair training, Vestibular training, and DME instructions ?  ?PLAN FOR NEXT SESSION: Update/review HEP, static and dynamic balance, functional activities, obstacle course, visual scanning tasks, coordination tasks, SLS, cognitive dual task ? ? ? ?Bary Richard, PT, DPT ?07/30/2021, 2:14 PM ? ?   ?

## 2021-08-01 ENCOUNTER — Ambulatory Visit: Payer: Commercial Managed Care - HMO | Admitting: Occupational Therapy

## 2021-08-01 DIAGNOSIS — R2689 Other abnormalities of gait and mobility: Secondary | ICD-10-CM | POA: Diagnosis not present

## 2021-08-01 DIAGNOSIS — R278 Other lack of coordination: Secondary | ICD-10-CM

## 2021-08-01 DIAGNOSIS — R2681 Unsteadiness on feet: Secondary | ICD-10-CM

## 2021-08-01 DIAGNOSIS — R208 Other disturbances of skin sensation: Secondary | ICD-10-CM

## 2021-08-01 NOTE — Therapy (Signed)
[place] [facility] ?[place], [place], [address] ?Phone: [phone]   Fax:  [phone] ? ?Occupational Therapy Treatment ? ?Patient Details  ?Name: Joshua Harrington ?MRN: [id number] ?Date of Birth: 1961-01-18 ?Referring Provider (OT): Dr. Bernadette Hoit (PCP) ? ? ?Encounter Date: 08/01/2021 ? ? OT End of Session - 08/01/21 1233   ? ? Visit Number 7   ? Number of Visits 17   ? Date for OT Re-Evaluation 09/08/21   ? Authorization Type Cigna/Cigna Managed - 30 combined visits (PT/OT); counts as 1 if seen on same day   ? OT Start Time 1230   ? OT Stop Time 1315   ? OT Time Calculation (min) 45 min   ? Activity Tolerance Patient tolerated treatment well   ? Behavior During Therapy [facility] for tasks assessed/performed   ? ?  ?  ? ?  ? ? ?Past Medical History:  ?Diagnosis Date  ? Diabetes mellitus without complication ([place])   ? Hernia   ? High cholesterol   ? Hypertension   ? ? ?Past Surgical History:  ?Procedure Laterality Date  ? ABDOMINAL SURGERY    ? HERNIA REPAIR    ? IR ANGIO INTRA EXTRACRAN SEL COM CAROTID INNOMINATE BILAT MOD SED  07/02/2021  ? IR ANGIO VERTEBRAL SEL VERTEBRAL UNI L MOD SED  07/02/2021  ? IR ANGIO VERTEBRAL SEL VERTEBRAL UNI R MOD SED  07/02/2021  ? IR US GUIDE VASC ACCESS RIGHT  07/02/2021  ? VASECTOMY    ? ? ?There were no vitals filed for this visit. ? ? Subjective Assessment - 08/01/21 1233   ? ? Subjective  I'm using my Rt hand to shave w/ the electric razor. My balance is better   ? 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 PMH: HLD, HTN, T2DM, h/o CVA (Jan 2023), VAD   ? Limitations Fall risk, ? driving   ? Currently in Pain? Yes   ? Pain Score 3    ? Pain Location Hip   thigh  ? Pain Orientation Left   ? Pain Descriptors / Indicators Sharp   ? Pain Onset More than a month ago   ? Pain Frequency Intermittent   ? Aggravating Factors  lifting the leg   ? Pain Relieving Factors rest, OTC meds   ? ?  ?  ? ?  ? ?Pt cooking egg demo good safety  I'ly - pt needed one v.c to keep one hand on countertop to retrieve things from low shelves for balance/fall prevention. Otherwise, pt did task very well.  ? ?UBE x 8 min, level 3 resistance for UB endurance/strength ? ?Environmental Scanning with 10/14 accuracy = 71% on first pass. Pt found 3/4 remaining items on 2nd pass (2 on Rt, 1 on Lt). On 3rd pass pt found remaining item on Lt  ? ? ? ? ? ? ? ? ? ? ? ? ? ? ? ? ? ? ? ? ? ? ? OT Short Term Goals - 07/25/21 1408   ? ?  ? OT SHORT TERM GOAL #1  ? Title Independent with HEP for coordination and strength   ? Time 4   ? Period Weeks   ? Status Achieved   ?  ? OT SHORT TERM GOAL #2  ? Title Pt to improve coordination Rt hand to 30 sec or less   ? Baseline 38 sec   ? Time 4   ? Period Weeks   ? Status Achieved   25.25  sec  ?  ? OT SHORT TERM GOAL #3  ? 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 Baseline 62 lbs   ? Time 4   ? Period Weeks   ? Status Achieved   84 lbs  ?  ? OT SHORT TERM GOAL #4  ? Title Pt to report greater ease eating with built up utensils prn   ? Time 4   ? Period Weeks   ? Status Achieved   ?  ? OT SHORT TERM GOAL #5  ? Title Pt to write name at 90% or greater legibility   ? Baseline 75%   ? Time 4   ? Period Weeks   ? Status Achieved   ? ?  ?  ? ?  ? ? ? ? OT Long Term Goals - 08/01/21 1259   ? ?  ? OT LONG TERM GOAL #1  ? Title Independent with updated HEP prn   ? Time 8   ? Period Weeks   ? Status New   ?  ? OT LONG TERM GOAL #2  ? Title Improve coordination Rt hand as evidenced by performing 9 hole peg test in 22 sec or less   ? Baseline 38 sec   ? Time 8   ? Period Weeks   ? Status Revised   25.25 sec  ?  ? OT LONG TERM GOAL #3  ? Title Pt to write 3 sentences maintaining 90% or greater legibility   ? Time 8   ? Period Weeks   ? Status On-going   ?  ? OT LONG TERM GOAL #4  ? Title Pt to return to leisure activities (playing guitar, drums, and light mechanics) with increased  time prn   ? Time 8   ? Period Weeks   ? Status On-going   ?  ? OT LONG TERM GOAL #5  ? Title Pt to return to cooking at [facility] safely w/ distant supervision   ? Time 8   ? Period Weeks   ? Status Achieved   ?  ? OT LONG TERM GOAL #6  ? Title Pt to perform environmental scanning in busy gym w/ simple physical task at 85% or greater accuracy   ? Time 8   ? Period Weeks   ? Status On-going   07/25/21: 11/14 on first pass, 07/30/21: 9/15 on first pass  ? ?  ?  ? ?  ? ? ? ? ? ? ? ? Plan - 08/01/21 1237   ? ? Clinical Impression Statement Pt progressing towards goals. Pt demo safety with cooking today   ? OT Occupational Profile and History Problem Focused Assessment - Including review of records relating to presenting problem   ? Occupational performance deficits (Please refer to evaluation for details): IADL's;Leisure;ADL's   ? Body Structure / Function / Physical Skills Strength;ADL;Decreased knowledge of use of DME;Dexterity;Balance;UE functional use;IADL;ROM;Coordination;FMC;Sensation   ? Rehab Potential Good   ? Clinical Decision Making Several treatment options, min-mod task modification necessary   ? Comorbidities Affecting Occupational Performance: May have comorbidities impacting occupational performance   ? Modification or Assistance to Complete Evaluation  No modification of tasks or assist necessary to complete eval   ? OT Frequency 2x / week   ? OT Duration 8 weeks   plus evaluation (anticipate only 6 weeks needed)  ? OT Treatment/Interventions Self-care/ADL training;DME and/or AE instruction;Therapeutic activities;Coping  strategies training;Therapeutic exercise;Neuromuscular education;Patient/family education;Manual Therapy;Functional Mobility Training;Visual/perceptual remediation/compensation   ? Plan progress towards remaining LTG's   ? Consulted and Agree with Plan of Care Patient   ? ?  ?  ? ?  ? ? ?Patient will benefit from skilled therapeutic intervention in order to improve the following deficits and  impairments:   ?Body Structure / Function / Physical Skills: Strength, ADL, Decreased knowledge of use of DME, Dexterity, Balance, UE functional use, IADL, ROM, Coordination, FMC, Sensation ?  ?  ? ? ?Visit Diagnosis: ?Other lack of coordination ? ?Unsteadiness on feet ? ?Other disturbances of skin sensation ? ? ? ?Problem List ?Patient Active Problem List  ? Diagnosis Date Noted  ? History of CVA (cerebrovascular accident) 07/02/2021  ? Vertebral artery disease ([place]) 07/02/2021  ? Obesity (BMI 30-39.9) 07/02/2021  ? Right sided numbness 07/02/2021  ? Right-sided numbness secondary to CVA (cerebral vascular accident) ([place]) 07/01/2021  ? Essential hypertension 06/01/2021  ? Type 2 diabetes mellitus with complication, without long-term current use of insulin ([place]) 06/01/2021  ? Right pontine stroke ([place]) 05/31/2021  ? Hyperglycemia due to type 2 diabetes mellitus ([place]) 06/13/2020  ? Male hypogonadism 06/13/2020  ? Mixed hyperlipidemia 06/13/2020  ? Sciatica 06/13/2020  ? Vitamin D deficiency 06/13/2020  ? Environmental allergies 03/18/2018  ? Recurrent incisional hernia with incarceration 08/05/2012  ? ? ?Carey Bullocks, OTR/L ?08/01/2021, 1:00 PM ? ?[place] ?[place] ?[place], [place], [address] ?Phone: [phone]   Fax:  [phone] ? ?Name: Jazon Fleming ?MRN: [id number] ?Date of Birth: [birth year] ? ?

## 2021-08-06 ENCOUNTER — Ambulatory Visit: Payer: Commercial Managed Care - HMO | Attending: Internal Medicine | Admitting: Occupational Therapy

## 2021-08-06 ENCOUNTER — Ambulatory Visit: Payer: Commercial Managed Care - HMO | Admitting: Physical Therapy

## 2021-08-06 ENCOUNTER — Encounter: Payer: Self-pay | Admitting: Physical Therapy

## 2021-08-06 ENCOUNTER — Encounter: Payer: Self-pay | Admitting: Occupational Therapy

## 2021-08-06 DIAGNOSIS — R2681 Unsteadiness on feet: Secondary | ICD-10-CM

## 2021-08-06 DIAGNOSIS — R2689 Other abnormalities of gait and mobility: Secondary | ICD-10-CM | POA: Insufficient documentation

## 2021-08-06 DIAGNOSIS — R278 Other lack of coordination: Secondary | ICD-10-CM | POA: Diagnosis present

## 2021-08-06 DIAGNOSIS — M6281 Muscle weakness (generalized): Secondary | ICD-10-CM | POA: Insufficient documentation

## 2021-08-06 NOTE — Therapy (Addendum)
?OUTPATIENT PHYSICAL THERAPY TREATMENT NOTE ? ? ?Patient Name: Joshua Harrington ?MRN: [id number] ?DOB:11-17-1960, 61 y.o., male ?Today's Date: 08/06/2021 ? ?PCP: [name], MD ?REFERRING PROVIDER: [name], MD ? ? PT End of Session - 08/06/21 1318   ? ? Visit Number 6   ? Number of Visits 9   8+eval  ? Date for PT Re-Evaluation 09/07/21   ? Authorization Type Cigna 2023-VL: 30 combined (PT&OT) same day = 1 visit   ? PT Start Time 1316   ? PT Stop Time 0355   ? PT Time Calculation (min) 38 min   ? Equipment Utilized During Treatment Gait belt   ? Activity Tolerance Patient tolerated treatment well   ? Behavior During Therapy [facility] for tasks assessed/performed   ? ?  ?  ? ?  ? ? ? ?Past Medical History:  ?Diagnosis Date  ? Diabetes mellitus without complication ([place])   ? Hernia   ? High cholesterol   ? Hypertension   ? ?Past Surgical History:  ?Procedure Laterality Date  ? ABDOMINAL SURGERY    ? HERNIA REPAIR    ? IR ANGIO INTRA EXTRACRAN SEL COM CAROTID INNOMINATE BILAT MOD SED  07/02/2021  ? IR ANGIO VERTEBRAL SEL VERTEBRAL UNI L MOD SED  07/02/2021  ? IR ANGIO VERTEBRAL SEL VERTEBRAL UNI R MOD SED  07/02/2021  ? IR US GUIDE VASC ACCESS RIGHT  07/02/2021  ? VASECTOMY    ? ?Patient Active Problem List  ? Diagnosis Date Noted  ? History of CVA (cerebrovascular accident) 07/02/2021  ? Vertebral artery disease ([place]) 07/02/2021  ? Obesity (BMI 30-39.9) 07/02/2021  ? Right sided numbness 07/02/2021  ? Right-sided numbness secondary to CVA (cerebral vascular accident) ([place]) 07/01/2021  ? Essential hypertension 06/01/2021  ? Type 2 diabetes mellitus with complication, without long-term current use of insulin ([place]) 06/01/2021  ? Right pontine stroke ([place]) 05/31/2021  ? Hyperglycemia due to type 2 diabetes mellitus ([place]) 06/13/2020  ? Male hypogonadism 06/13/2020  ? Mixed hyperlipidemia 06/13/2020  ? Sciatica 06/13/2020  ? Vitamin D deficiency 06/13/2020  ? Environmental allergies 03/18/2018  ? Recurrent incisional hernia  with incarceration 08/05/2012  ? ? ?REFERRING DIAG: R20.0 (ICD-10-CM) - Right sided numbness   ? ?THERAPY DIAG:  ?Unsteadiness on feet ? ?Other abnormalities of gait and mobility ? ?PERTINENT HISTORY: DM2, Hyperlipidemia, HTN  ? ?PRECAUTIONS: Fall ? ?SUBJECTIVE:  He states he is walking more without falls and feels he is moving around better. ? ?PAIN:  ?Are you having pain? No ? ?OBJECTIVE:  ?TREATMENT: ?Reassessed STGs.  See below. ?Trialed large base quad cane on R x115' w/ pt demo dec speed, dec stride, carrying LUE in flexion/adduction without arm swing and inc cognitive load during sequencing with minA for cuing to promote safety with sequence.  115' level surface in gym w/o AD with improved stride, good foot clearance and arm swing, soft knee buckle on left x1 ?Reviewed and updated HEP to green theraband ?6" stair step ups for functional LE strengthening 2x10 each LE; LOB x1 when stepping down w/ RLE w/o UE support, first set using R rail, second set using no UE support ?Pt performs cone weaving between narrow and wide spaced cones over level and compliant blue mat surface x6 to promote visual scanning and dynamic balance with turns ? ?PATIENT EDUCATION: ?Education details: . ?Person educated: Patient; Wife ?Education method: Explanation ?Education comprehension: verbalized understanding ?  ?  ?HOME EXERCISE PROGRAM: ?Access Code: [email or web address] ?URL: [email or web address] ?Date: 07/11/2021 ?Prepared by:  Jannah Plaster ? ?Exercises -reprinted 08/06/2021 per pt request ?Side Stepping with Resistance at Thighs and Counter Support - 1 x daily - 7 x weekly - 3 sets - 10 reps ?Seated March with Resistance - 1 x daily - 7 x weekly - 3 sets - 10 reps - 2s hold ?Sit to Stand with Resistance Around Legs - 1 x daily - 7 x weekly - 3 sets - 10 reps ?Forward Backward Monster Walk with Band at [employer] and [employer] - 1 x daily - 7 x weekly - 3 sets - 10 reps ?Modified to green theraband ?  ?ASSESSMENT: ?   ?CLINICAL IMPRESSION: ?Assessed STGs today with pt meeting 3 of 4 goals and partially meeting 5xSTS goal.  His TUG improved to 10.62 sec and 5xSTS improved to 17.18sec both indicating improvement in LE strength and fall risk.  He is demonstrating improvements in gait and safety with functional mobility without use of a cane.  Continue per POC. ?   ?OBJECTIVE IMPAIRMENTS Abnormal gait, decreased activity tolerance, decreased balance, decreased coordination, decreased endurance, decreased knowledge of condition, decreased knowledge of use of DME, decreased mobility, difficulty walking, decreased ROM, decreased strength, decreased safety awareness, and impaired perceived functional ability.  ?  ?ACTIVITY LIMITATIONS community activity, driving, occupation, Medical sales representative, yard work, and shopping.  ?  ?PERSONAL FACTORS Fitness, Past/current experiences, Time since onset of injury/illness/exacerbation, Transportation, and 1-2 comorbidities: DM2, Hyperlipidemia, HTN  are also affecting patient's functional outcome.  ?  ?  ?  ?GOALS: ?Goals reviewed with patient? Yes ?  ?SHORT TERM GOALS: ?  ?Pt will decrease 5xSTS to <16 seconds in order to demonstrate decreased risk for falls and improved functional bilateral LE strength and power. ?Baseline: 19.66 sec; 08/06/2021 17.18 sec no hands standard chair ?Target date:  08/10/2021 ?Goal status: PARTIALLY MET ?  ?2.  Pt will demonstrate TUG of <13 seconds in order to decrease risk of falls and improve functional mobility using LRAD. ?Baseline: 15.35 sec; 08/06/2021 10.62 sec ?Target date:  08/10/2021 ?Goal status: MET ?  ?3. Pt will trial quad cane with gait training in PT and PT to obtain order from MD as necessary to promote safety. ?Baseline: Pt walks without AD, unsteady requiring CGA to prevent R buckle.; trialed 3/13 and 4/3 not needed for current functional status ?Target date:  08/10/2021 ?Goal status: MET ?  ?4.  Pt will be independent with strength and balance HEP. ?Baseline: 08/06/2021  Pt able to teach back exercise routine. ?Target date:  08/10/2021 ?Goal status: MET ?  ?  ?  ?LONG TERM GOALS: ?  ?Pt will demonstrate improved R attention and scanning with use of LRAD for safety with gait.  ?Baseline: Pt uses no AD, mild R inattention, difficulty navigating obstacles on R ?Target date:  09/07/2021 ?Goal status: INITIAL ?  ?2.  Pt will ambulate >800' feet with LRAD and SBA level of assist to promote household and community access.  ?Baseline: 09/07/2021 ?Target date:  09/07/2021 ?Goal status: INITIAL ?  ?3.  Pt will increase BERG balance score to >/=40/56 to demonstrate improved static balance. ?Baseline: 31/56= high fall risk ?Target date:  09/07/2021 ?Goal status: INITIAL ?  ?4.  Pt will demonstrate a gait speed of >1.75 feet/sec at normal speed in order to decrease risk for falls. ?Baseline: normal speed 1.24 ft/sec ?Target date:  09/07/2021 ?Goal status: INITIAL ?  ?5.  Pt will increase FOTO score to 76% in order to demonstrate subjective functional improvement.  ?Baseline: 58% ?Target date:  09/07/2021 ?Goal status: INITIAL ?  ?  ?PLAN: ?PT FREQUENCY: 1x/week ?  ?PT DURATION: 8 weeks ?  ?PLANNED INTERVENTIONS: Therapeutic exercises, Therapeutic activity, Neuromuscular re-education, Balance training, Gait training, Patient/Family education, Joint mobilization, Stair training, Vestibular training, and DME instructions ?  ?PLAN FOR NEXT SESSION: Finalize HEP, static and dynamic balance, functional activities, obstacle course, visual scanning tasks, coordination tasks, SLS, cognitive dual task, gait training w/ emphasis on speed and head turns-no AD. ? ? ? ?Bary Richard, PT, DPT ?08/06/2021, 2:00 PM ? ?   ?

## 2021-08-06 NOTE — Therapy (Signed)
[place] ?[place] ?[place], [place], [address] ?Phone: [phone]   Fax:  [phone] ? ?Occupational Therapy Treatment ? ?Patient Details  ?Name: Joshua Harrington ?MRN: [id number] ?Date of Birth: July 18, 1960 ?Referring Provider (OT): Dr. Bernadette Hoit (PCP) ? ? ?Encounter Date: 08/06/2021 ? ? OT End of Session - 08/06/21 1229   ? ? Visit Number 8   ? Number of Visits 17   ? Date for OT Re-Evaluation 09/08/21   ? Authorization Type Cigna/Cigna Managed - 30 combined visits (PT/OT); counts as 1 if seen on same day   ? OT Start Time 1230   ? OT Stop Time 1315   ? OT Time Calculation (min) 45 min   ? Activity Tolerance Patient tolerated treatment well   ? Behavior During Therapy [facility] for tasks assessed/performed   ? ?  ?  ? ?  ? ? ?Past Medical History:  ?Diagnosis Date  ? Diabetes mellitus without complication ([place])   ? Hernia   ? High cholesterol   ? Hypertension   ? ? ?Past Surgical History:  ?Procedure Laterality Date  ? ABDOMINAL SURGERY    ? HERNIA REPAIR    ? IR ANGIO INTRA EXTRACRAN SEL COM CAROTID INNOMINATE BILAT MOD SED  07/02/2021  ? IR ANGIO VERTEBRAL SEL VERTEBRAL UNI L MOD SED  07/02/2021  ? IR ANGIO VERTEBRAL SEL VERTEBRAL UNI R MOD SED  07/02/2021  ? IR US GUIDE VASC ACCESS RIGHT  07/02/2021  ? VASECTOMY    ? ? ?There were no vitals filed for this visit. ? ? Subjective Assessment - 08/06/21 1230   ? ? Subjective  I replaced my car battery this past Friday   ? 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 PMH: HLD, HTN, T2DM, h/o CVA (Jan 2023), VAD   ? Limitations Fall risk, ? driving   ? Currently in Pain? Yes   ? Pain Score 3    ? Pain Location Hip   and thigh  ? Pain Orientation Left   ? Pain Type Acute pain   ? Pain Onset More than a month ago   ? Pain Frequency Intermittent   ? Aggravating Factors  only when lifting the leg   ? Pain Relieving Factors rest, OTC meds   ? ?  ?  ? ?  ? ?Assessed progress towards LTG's - see goal section.  ?Pt reports  practicing guitar which is getting better but drums is still a challenge. Pt changed car battery last Friday. Pt wrote 3 sentences at 95-100% legibility ?9 hole peg test Rt = 23.75 sec, Grip strength Rt = 73 lbs ? ?Pt placing O'Connor pegs in pegboard Rt hand, then removing w/ tweezers w/o difficulty. Increased challenge to place pegs in w/ tweezers w/ mod difficulty, however w/ repetition only min difficulty. Pt screwing/unscrewing nuts and bolts w/ Rt hand (Lt hand as stabilizer). Placing beads on golf tees Rt hand, then removing one at a time.  ? ?Gripper set at level 4 resistance to pick up blocks Rt hand for sustained grip strength w/ mod difficulty and increased drops as pt's hand fatigues. 2 rest breaks required ? ? ? ? ? ? ? ? ? ? ? ? ? ? ? ? ? ? ? ? ? ? ? ? OT Short Term Goals - 07/25/21 1408   ? ?  ? OT SHORT TERM GOAL #1  ? Title Independent with HEP for coordination and strength   ? Time 4   ?  Period Weeks   ? Status Achieved   ?  ? OT SHORT TERM GOAL #2  ? Title Pt to improve coordination Rt hand to 30 sec or less   ? Baseline 38 sec   ? Time 4   ? Period Weeks   ? Status Achieved   25.25 sec  ?  ? OT SHORT TERM GOAL #3  ? 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 Baseline 62 lbs   ? Time 4   ? Period Weeks   ? Status Achieved   84 lbs  ?  ? OT SHORT TERM GOAL #4  ? Title Pt to report greater ease eating with built up utensils prn   ? Time 4   ? Period Weeks   ? Status Achieved   ?  ? OT SHORT TERM GOAL #5  ? Title Pt to write name at 90% or greater legibility   ? Baseline 75%   ? Time 4   ? Period Weeks   ? Status Achieved   ? ?  ?  ? ?  ? ? ? ? OT Long Term Goals - 08/06/21 1231   ? ?  ? OT LONG TERM GOAL #1  ? Title Independent with updated HEP prn   ? Time 8   ? Period Weeks   ? Status Achieved   ?  ? OT LONG TERM GOAL #2  ? Title Improve coordination Rt hand as evidenced by performing 9 hole peg test in 22 sec or less   ?  Baseline 38 sec   ? Time 8   ? Period Weeks   ? Status On-going   25.25 sec, 08/06/21: 23.75 sec  ?  ? OT LONG TERM GOAL #3  ? Title Pt to write 3 sentences maintaining 90% or greater legibility   ? Time 8   ? Period Weeks   ? Status Achieved   ?  ? OT LONG TERM GOAL #4  ? Title Pt to return to leisure activities (playing guitar, drums, and light mechanics) with increased time prn   ? Time 8   ? Period Weeks   ? Status On-going   Pt reports taking old battery out of car and replacing battery I'ly  ?  ? OT LONG TERM GOAL #5  ? Title Pt to return to cooking at [facility] safely w/ distant supervision   ? Time 8   ? Period Weeks   ? Status Achieved   ?  ? OT LONG TERM GOAL #6  ? Title Pt to perform environmental scanning in busy gym w/ simple physical task at 85% or greater accuracy   ? Time 8   ? Period Weeks   ? Status On-going   07/25/21: 11/14 on first pass, 07/30/21: 9/15 on first pass, 08/01/21: 10/14 on first pass  ? ?  ?  ? ?  ? ? ? ? ? ? ? ? Plan - 08/06/21 1241   ? ? Clinical Impression Statement Pt progressing towards goals. Pt has met 3/6 LTG's.   ? OT Occupational Profile and History Problem Focused Assessment - Including review of records relating to presenting problem   ? Occupational performance deficits (Please refer to evaluation for details): IADL's;Leisure;ADL's   ? Body Structure / Function / Physical Skills Strength;ADL;Decreased knowledge of use of DME;Dexterity;Balance;UE functional use;IADL;ROM;Coordination;FMC;Sensation   ? Rehab Potential Good   ? Clinical Decision  Making Several treatment options, min-mod task modification necessary   ? Comorbidities Affecting Occupational Performance: May have comorbidities impacting occupational performance   ? Modification or Assistance to Complete Evaluation  No modification of tasks or assist necessary to complete eval   ? OT Frequency 2x / week   ? OT Duration 8 weeks   plus evaluation (anticipate only 6 weeks needed)  ? OT Treatment/Interventions Self-care/ADL  training;DME and/or AE instruction;Therapeutic activities;Coping strategies training;Therapeutic exercise;Neuromuscular education;Patient/family education;Manual Therapy;Functional Mobility Training;Visual/perceptual remediation/compensation   ? Plan pt to bring in drum sticks next session to practice, continue environmental scanning, [place]   ? Consulted and Agree with Plan of Care Patient   ? ?  ?  ? ?  ? ? ?Patient will benefit from skilled therapeutic intervention in order to improve the following deficits and impairments:   ?Body Structure / Function / Physical Skills: Strength, ADL, Decreased knowledge of use of DME, Dexterity, Balance, UE functional use, IADL, ROM, Coordination, FMC, Sensation ?  ?  ? ? ?Visit Diagnosis: ?Other lack of coordination ? ?Unsteadiness on feet ? ?Muscle weakness (generalized) ? ? ? ?Problem List ?Patient Active Problem List  ? Diagnosis Date Noted  ? History of CVA (cerebrovascular accident) 07/02/2021  ? Vertebral artery disease ([place]) 07/02/2021  ? Obesity (BMI 30-39.9) 07/02/2021  ? Right sided numbness 07/02/2021  ? Right-sided numbness secondary to CVA (cerebral vascular accident) ([place]) 07/01/2021  ? Essential hypertension 06/01/2021  ? Type 2 diabetes mellitus with complication, without long-term current use of insulin ([place]) 06/01/2021  ? Right pontine stroke ([place]) 05/31/2021  ? Hyperglycemia due to type 2 diabetes mellitus ([place]) 06/13/2020  ? Male hypogonadism 06/13/2020  ? Mixed hyperlipidemia 06/13/2020  ? Sciatica 06/13/2020  ? Vitamin D deficiency 06/13/2020  ? Environmental allergies 03/18/2018  ? Recurrent incisional hernia with incarceration 08/05/2012  ? ? ?Carey Bullocks, OTR/L ?08/06/2021, 12:47 PM ? ?[place] ?[place] ?[place], [place], [address] ?Phone: [phone]   Fax:  [phone] ? ?Name: Jovonta Levit ?MRN: [id number] ?Date of Birth: 11/18/1960 ? ?

## 2021-08-08 ENCOUNTER — Encounter: Payer: Self-pay | Admitting: Occupational Therapy

## 2021-08-08 ENCOUNTER — Ambulatory Visit: Payer: Commercial Managed Care - HMO | Admitting: Occupational Therapy

## 2021-08-08 DIAGNOSIS — M6281 Muscle weakness (generalized): Secondary | ICD-10-CM

## 2021-08-08 DIAGNOSIS — R278 Other lack of coordination: Secondary | ICD-10-CM

## 2021-08-08 DIAGNOSIS — R2681 Unsteadiness on feet: Secondary | ICD-10-CM

## 2021-08-08 NOTE — Therapy (Signed)
[place] ?[facility] ?[address] ?[place], [place], [address] ?Phone: [phone]   Fax:  [phone] ? ?Occupational Therapy Treatment ? ?Patient Details  ?Name: Joshua Harrington ?MRN: [id number] ?Date of Birth: June 16, 1960 ?Referring Provider (OT): Dr. Venida Jarvisnsei-Bonsu (PCP) ? ? ?Encounter Date: 08/08/2021 ? ? OT End of Session - 08/08/21 1237   ? ? Visit Number 9   ? Number of Visits 17   ? Date for OT Re-Evaluation 09/08/21   ? Authorization Type Cigna/Cigna Managed - 30 combined visits (PT/OT); counts as 1 if seen on same day   ? OT Start Time 1235   ? OT Stop Time 1315   ? OT Time Calculation (min) 40 min   ? Activity Tolerance Patient tolerated treatment well   ? Behavior [facility] Therapy [facility] for tasks assessed/performed   ? ?  ?  ? ?  ? ? ?Past Medical History:  ?Diagnosis Date  ? Diabetes mellitus without complication ([facility])   ? Hernia   ? High cholesterol   ? Hypertension   ? ? ?Past Surgical History:  ?Procedure Laterality Date  ? ABDOMINAL SURGERY    ? HERNIA REPAIR    ? IR ANGIO INTRA EXTRACRAN SEL COM CAROTID INNOMINATE BILAT MOD SED  07/02/2021  ? IR ANGIO VERTEBRAL SEL VERTEBRAL UNI L MOD SED  07/02/2021  ? IR ANGIO VERTEBRAL SEL VERTEBRAL UNI R MOD SED  07/02/2021  ? IR US GUIDE VASC ACCESS RIGHT  07/02/2021  ? VASECTOMY    ? ? ?There were no vitals filed for this visit. ? ? Subjective Assessment - 08/08/21 1237   ? ? Subjective  denies pain   ? 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 PMH: HLD, HTN, T2DM, h/o CVA (Jan 2023), VAD   ? Limitations Fall risk, ? driving   ? Patient Stated Goals Get back to normal   ? Currently in Pain? No/denies   ? Pain Onset More than a month ago   ? ?  ?  ? ?  ? ? ?Pt brought in drum sticks today - simulated drumming to beat of song using large physioballs ? ?Gross motor coordination tasks: tossing/catching volleyball sized ball with both hands, no difficulty. Tossing and catching small ball w/ same hand (Rt hand, then alternating Rt,  Lt, then Rt/Lt simultaneously). Dribbling large ball, then progressed to different speeds, then progressed to walking and dribbling.  ? ?Environmental scanning finding 14/16 items (87.5% accuracy) - however, pt's improved accuracy is partially due to anticipated location of items from doing many times in past sessions. Pt found remaining 2 items on 2nd pass.  ? ? ? ? ? ? ? ? ? ? ? ? ? ? ? ? ? ? ? ? ? ? ? ? OT Short Term Goals - 07/25/21 1408   ? ?  ? OT SHORT TERM GOAL #1  ? Title Independent with HEP for coordination and strength   ? Time 4   ? Period Weeks   ? Status Achieved   ?  ? OT SHORT TERM GOAL #2  ? Title Pt to improve coordination Rt hand to 30 sec or less   ? Baseline 38 sec   ? Time 4   ? Period Weeks   ? Status Achieved   25.25 sec  ?  ? OT SHORT TERM GOAL #3  ? 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  Baseline 62 lbs   ? Time 4   ? Period Weeks   ? Status Achieved   84 lbs  ?  ? OT SHORT TERM GOAL #4  ? Title Pt to report greater ease eating with built up utensils prn   ? Time 4   ? Period Weeks   ? Status Achieved   ?  ? OT SHORT TERM GOAL #5  ? Title Pt to write name at 90% or greater legibility   ? Baseline 75%   ? Time 4   ? Period Weeks   ? Status Achieved   ? ?  ?  ? ?  ? ? ? ? OT Long Term Goals - 08/06/21 1231   ? ?  ? OT LONG TERM GOAL #1  ? Title Independent with updated HEP prn   ? Time 8   ? Period Weeks   ? Status Achieved   ?  ? OT LONG TERM GOAL #2  ? Title Improve coordination Rt hand as evidenced by performing 9 hole peg test in 22 sec or less   ? Baseline 38 sec   ? Time 8   ? Period Weeks   ? Status On-going   25.25 sec, 08/06/21: 23.75 sec  ?  ? OT LONG TERM GOAL #3  ? Title Pt to write 3 sentences maintaining 90% or greater legibility   ? Time 8   ? Period Weeks   ? Status Achieved   ?  ? OT LONG TERM GOAL #4  ? Title Pt to return to leisure activities (playing guitar, drums, and light mechanics) with increased  time prn   ? Time 8   ? Period Weeks   ? Status On-going   Pt reports taking old battery out of car and replacing battery I'ly  ?  ? OT LONG TERM GOAL #5  ? Title Pt to return to cooking at [facility] safely w/ distant supervision   ? Time 8   ? Period Weeks   ? Status Achieved   ?  ? OT LONG TERM GOAL #6  ? Title Pt to perform environmental scanning in busy gym w/ simple physical task at 85% or greater accuracy   ? Time 8   ? Period Weeks   ? Status On-going   07/25/21: 11/14 on first pass, 07/30/21: 9/15 on first pass, 08/01/21: 10/14 on first pass  ? ?  ?  ? ?  ? ? ? ? ? ? ? ? Plan - 08/08/21 1321   ? ? Clinical Impression Statement Pt progressing with gross motor coordination. Pt with improved accuracy w/ environmental scanning today however may be partially due to anticipated location of items   ? OT Occupational Profile and History Problem Focused Assessment - Including review of records relating to presenting problem   ? Occupational performance deficits (Please refer to evaluation for details): IADL's;Leisure;ADL's   ? Body Structure / Function / Physical Skills Strength;ADL;Decreased knowledge of use of DME;Dexterity;Balance;UE functional use;IADL;ROM;Coordination;FMC;Sensation   ? Rehab Potential Good   ? Clinical Decision Making Several treatment options, min-mod task modification necessary   ? Comorbidities Affecting Occupational Performance: May have comorbidities impacting occupational performance   ? Modification or Assistance to Complete Evaluation  No modification of tasks or assist necessary to complete eval   ? OT Frequency 2x / week   ? OT Duration 8 weeks   plus evaluation (anticipate only 6 weeks needed)  ? OT Treatment/Interventions Self-care/ADL training;DME and/or AE instruction;Therapeutic activities;Coping  strategies training;Therapeutic exercise;Neuromuscular education;Patient/family education;Manual Therapy;Functional Mobility Training;Visual/perceptual remediation/compensation   ? Plan continue  gross motor coordination (dribbling large ball at different speeds, dribbling while walking), challenging FMC tasks, anticipate d/c within next 2 O.T. sessions   ? Consulted and Agree with Plan of Care Patient   ? ?  ?  ? ?  ? ? ?Patient will benefit from skilled therapeutic intervention in order to improve the following deficits and impairments:   ?Body Structure / Function / Physical Skills: Strength, ADL, Decreased knowledge of use of DME, Dexterity, Balance, UE functional use, IADL, ROM, Coordination, FMC, Sensation ?  ?  ? ? ?Visit Diagnosis: ?Other lack of coordination ? ?Muscle weakness (generalized) ? ?Unsteadiness on feet ? ? ? ?Problem List ?Patient Active Problem List  ? Diagnosis Date Noted  ? History of CVA (cerebrovascular accident) 07/02/2021  ? Vertebral artery disease ([facility]) 07/02/2021  ? Obesity (BMI 30-39.9) 07/02/2021  ? Right sided numbness 07/02/2021  ? Right-sided numbness secondary to CVA (cerebral vascular accident) ([facility]) 07/01/2021  ? Essential hypertension 06/01/2021  ? Type 2 diabetes mellitus with complication, without long-term current use of insulin ([facility]) 06/01/2021  ? Right pontine stroke ([facility]) 05/31/2021  ? Hyperglycemia due to type 2 diabetes mellitus ([facility]) 06/13/2020  ? Male hypogonadism 06/13/2020  ? Mixed hyperlipidemia 06/13/2020  ? Sciatica 06/13/2020  ? Vitamin D deficiency 06/13/2020  ? Environmental allergies 03/18/2018  ? Recurrent incisional hernia with incarceration 08/05/2012  ? ? ?Sheran Lawless, OTR/L ?08/08/2021, 1:23 PM ? ?[place] ?[facility] ?[address] ?[place], [place], [address] ?Phone: [phone]   Fax:  [phone] ? ?Name: Joshua Harrington ?MRN: [id number] ?Date of Birth: 31-Aug-1960 ? ?

## 2021-08-13 ENCOUNTER — Ambulatory Visit: Payer: Commercial Managed Care - HMO | Admitting: Physical Therapy

## 2021-08-13 DIAGNOSIS — R2689 Other abnormalities of gait and mobility: Secondary | ICD-10-CM

## 2021-08-13 DIAGNOSIS — R278 Other lack of coordination: Secondary | ICD-10-CM | POA: Diagnosis not present

## 2021-08-13 DIAGNOSIS — R2681 Unsteadiness on feet: Secondary | ICD-10-CM

## 2021-08-13 DIAGNOSIS — M6281 Muscle weakness (generalized): Secondary | ICD-10-CM

## 2021-08-13 NOTE — Therapy (Signed)
?OUTPATIENT PHYSICAL THERAPY TREATMENT NOTE- DISCHARGE SUMMARY ? ? ?Patient Name: Joshua Harrington ?MRN: [id number] ?DOB:Jan 25, 1961, 61 y.o., male ?Today's Date: 08/13/2021 ? ?PCP: [name], MD ?REFERRING PROVIDER: Hosie Poisson, MD  ? ?PHYSICAL THERAPY DISCHARGE SUMMARY ? ?Visits from Start of Care: 7 ? ?Current functional level related to goals / functional outcomes: ?See below  ?  ?Remaining deficits: ?Global deconditioning, impaired balance during narrow BOS activities  ?  ?Education / Equipment: ?HEP, using caution during activity for improved balance   ? ?Patient agrees to discharge. Patient goals were met. Patient is being discharged due to being pleased with the current functional level and meeting goals. ? ? ? PT End of Session - 08/13/21 1317   ? ? Visit Number 7   ? Number of Visits 9   8+eval  ? Date for PT Re-Evaluation 09/07/21   ? Authorization Type Cigna 2023-VL: 30 combined (PT&OT) same day = 1 visit   ? PT Start Time 1315   ? PT Stop Time 9371   session ended early due to DC completion  ? PT Time Calculation (min) 32 min   ? Activity Tolerance Patient tolerated treatment well   ? Behavior During Therapy [facility] for tasks assessed/performed   ? ?  ?  ? ?  ? ? ? ? ?Past Medical History:  ?Diagnosis Date  ? Diabetes mellitus without complication ([place])   ? Hernia   ? High cholesterol   ? Hypertension   ? ?Past Surgical History:  ?Procedure Laterality Date  ? ABDOMINAL SURGERY    ? HERNIA REPAIR    ? IR ANGIO INTRA EXTRACRAN SEL COM CAROTID INNOMINATE BILAT MOD SED  07/02/2021  ? IR ANGIO VERTEBRAL SEL VERTEBRAL UNI L MOD SED  07/02/2021  ? IR ANGIO VERTEBRAL SEL VERTEBRAL UNI R MOD SED  07/02/2021  ? IR US GUIDE VASC ACCESS RIGHT  07/02/2021  ? VASECTOMY    ? ?Patient Active Problem List  ? Diagnosis Date Noted  ? History of CVA (cerebrovascular accident) 07/02/2021  ? Vertebral artery disease ([place]) 07/02/2021  ? Obesity (BMI 30-39.9) 07/02/2021  ? Right sided numbness 07/02/2021  ? Right-sided numbness  secondary to CVA (cerebral vascular accident) ([place]) 07/01/2021  ? Essential hypertension 06/01/2021  ? Type 2 diabetes mellitus with complication, without long-term current use of insulin ([place]) 06/01/2021  ? Right pontine stroke ([place]) 05/31/2021  ? Hyperglycemia due to type 2 diabetes mellitus ([place]) 06/13/2020  ? Male hypogonadism 06/13/2020  ? Mixed hyperlipidemia 06/13/2020  ? Sciatica 06/13/2020  ? Vitamin D deficiency 06/13/2020  ? Environmental allergies 03/18/2018  ? Recurrent incisional hernia with incarceration 08/05/2012  ? ? ?REFERRING DIAG: R20.0 (ICD-10-CM) - Right sided numbness   ? ?THERAPY DIAG:  ?Muscle weakness (generalized) ? ?Unsteadiness on feet ? ?Other abnormalities of gait and mobility ? ?PERTINENT HISTORY: DM2, Hyperlipidemia, HTN  ? ?PRECAUTIONS: Fall ? ?SUBJECTIVE:  Pt reports he has been driving since Wednesday and is "feeling great". Pt in agreement to DC from PT today  ? ?PAIN:  ?Are you having pain? No ? ?OBJECTIVE:  ? ?TREATMENT: ? Ther Act  ? [facility] PT Assessment - 08/13/21 1321   ? ?  ? Ambulation/Gait  ? Gait velocity 3.91 ft/s without AD   ?  ? Berg Balance Test  ? Sit to Stand Able to stand without using hands and stabilize independently   ? Standing Unsupported Able to stand safely 2 minutes   ? Sitting with Back Unsupported but Feet Supported on Floor  or Stool Able to sit safely and securely 2 minutes   ? Stand to Sit Sits safely with minimal use of hands   ? Transfers Able to transfer safely, minor use of hands   ? Standing Unsupported with Eyes Closed Able to stand 10 seconds safely   ? Standing Unsupported with Feet Together Able to place feet together independently and stand 1 minute safely   ? From Standing, Reach Forward with Outstretched Arm Can reach confidently >25 cm (10")   ? From Standing Position, Pick up Object from [place] to pick up shoe safely and easily   ? From Standing Position, Turn to Look Behind Over each Shoulder Looks behind from both sides and weight  shifts well   ? Turn 360 Degrees Able to turn 360 degrees safely in 4 seconds or less   ? Standing Unsupported, Alternately Place Feet on Step/Stool Able to stand independently and complete 8 steps >20 seconds   ? Standing Unsupported, One Foot in Front Able to plae foot ahead of the other independently and hold 30 seconds   ? Standing on One Leg Tries to lift leg/unable to hold 3 seconds but remains standing independently   ? Total Score 51   ? Berg comment: low-moderate fall risk   ? ?  ?  ? ?  ? ? ?Gait Training  ?Gait pattern: WFL ?Distance walked: 920'  ?Assistive device utilized: None ?Level of assistance: Modified independence ?Comments: Pt ambulated 920' around gym, 460' turning to L side and 460' turning to R side. Noted minor gait deviations to R side but pt demonstrated improved scanning to R side and avoided all obstacles on R side.  ? ? ? ?PATIENT EDUCATION: ?Education details: Finalized HEP, educated pt on continuing walking program and exercise at home. Informed pt to obtain new PT referral if functional mobility changes  ?Person educated: Patient  ?Education method: Explanation ?Education comprehension: verbalized understanding ?  ?  ?HOME EXERCISE PROGRAM: ?Access Code: [email or web address] ?URL: [email or web address] ?Date: 07/11/2021 ?Prepared by: Mickie Bail Demetrias Goodbar ? ?Exercises -reprinted 08/06/2021 per pt request ?Side Stepping with Resistance at Thighs and Counter Support - 1 x daily - 7 x weekly - 3 sets - 10 reps ?Seated March with Resistance - 1 x daily - 7 x weekly - 3 sets - 10 reps - 2s hold ?Sit to Stand with Resistance Around Legs - 1 x daily - 7 x weekly - 3 sets - 10 reps ?Forward Backward Monster Walk with Band at [employer] and [employer] - 1 x daily - 7 x weekly - 3 sets - 10 reps ?Modified to green theraband ?  ?ASSESSMENT: ?  ?CLINICAL IMPRESSION: ?Emphasis of session on assessing LTGs and discussing DC plan. Pt has achieved 4 of 5 LTGs, missing FOTO score by 6% as it is not safe  for pt to run at this time. Pt improved Berg to 51/56, indicative of low-moderate fall risk. Heavy emphasis on pt slowing down for improved balance, pt verbalized understanding. Encouraged pt to obtain new PT referral if change in functional mobility occurs, pt verbalized understanding. Pt in agreement to DC from PT today.  ?   ?OBJECTIVE IMPAIRMENTS Abnormal gait, decreased activity tolerance, decreased balance, decreased coordination, decreased endurance, decreased knowledge of condition, decreased knowledge of use of DME, decreased mobility, difficulty walking, decreased ROM, decreased strength, decreased safety awareness, and impaired perceived functional ability.  ?  ?ACTIVITY LIMITATIONS community activity, driving, occupation, Medical sales representative, yard work, and shopping.  ?  ?PERSONAL FACTORS  Fitness, Past/current experiences, Time since onset of injury/illness/exacerbation, Transportation, and 1-2 comorbidities: DM2, Hyperlipidemia, HTN  are also affecting patient's functional outcome.  ?  ?  ?  ?GOALS: ?Goals reviewed with patient? Yes ?  ?SHORT TERM GOALS: ?  ?Pt will decrease 5xSTS to <16 seconds in order to demonstrate decreased risk for falls and improved functional bilateral LE strength and power. ?Baseline: 19.66 sec; 08/06/2021 17.18 sec no hands standard chair ?Target date:  08/10/2021 ?Goal status: PARTIALLY MET ?  ?2.  Pt will demonstrate TUG of <13 seconds in order to decrease risk of falls and improve functional mobility using LRAD. ?Baseline: 15.35 sec; 08/06/2021 10.62 sec ?Target date:  08/10/2021 ?Goal status: MET ?  ?3. Pt will trial quad cane with gait training in PT and PT to obtain order from MD as necessary to promote safety. ?Baseline: Pt walks without AD, unsteady requiring CGA to prevent R buckle.; trialed 3/13 and 4/3 not needed for current functional status ?Target date:  08/10/2021 ?Goal status: MET ?  ?4.  Pt will be independent with strength and balance HEP. ?Baseline: 08/06/2021 Pt able to teach back  exercise routine. ?Target date:  08/10/2021 ?Goal status: MET ?  ?  ?  ?LONG TERM GOALS: ?  ?Pt will demonstrate improved R attention and scanning with use of LRAD for safety with gait.  ?Baseline: Pt uses no

## 2021-08-20 ENCOUNTER — Encounter: Payer: Self-pay | Admitting: Occupational Therapy

## 2021-08-20 ENCOUNTER — Ambulatory Visit: Payer: Commercial Managed Care - HMO | Admitting: Occupational Therapy

## 2021-08-20 DIAGNOSIS — R278 Other lack of coordination: Secondary | ICD-10-CM | POA: Diagnosis not present

## 2021-08-20 DIAGNOSIS — R2681 Unsteadiness on feet: Secondary | ICD-10-CM

## 2021-08-20 NOTE — Therapy (Signed)
[facility] ?[place] ?[place], [place], [address] ?Phone: [phone]   Fax:  [phone] ? ?Occupational Therapy Treatment ? ?Patient Details  ?Name: Joshua Harrington ?MRN: [id number] ?Date of Birth: 05-27-1960 ?Referring Provider (OT): Dr. Bernadette Hoit (PCP) ? ? ?Encounter Date: 08/20/2021 ? ? OT End of Session - 08/20/21 1232   ? ? Visit Number 10   ? Number of Visits 17   ? Date for OT Re-Evaluation 09/08/21   ? Authorization Type Cigna/Cigna Managed - 30 combined visits (PT/OT); counts as 1 if seen on same day   ? OT Start Time 1230   ? OT Stop Time 1253   ? OT Time Calculation (min) 23 min   ? Activity Tolerance Patient tolerated treatment well   ? Behavior During Therapy [facility] for tasks assessed/performed   ? ?  ?  ? ?  ? ? ?Past Medical History:  ?Diagnosis Date  ? Diabetes mellitus without complication ([place])   ? Hernia   ? High cholesterol   ? Hypertension   ? ? ?Past Surgical History:  ?Procedure Laterality Date  ? ABDOMINAL SURGERY    ? HERNIA REPAIR    ? IR ANGIO INTRA EXTRACRAN SEL COM CAROTID INNOMINATE BILAT MOD SED  07/02/2021  ? IR ANGIO VERTEBRAL SEL VERTEBRAL UNI L MOD SED  07/02/2021  ? IR ANGIO VERTEBRAL SEL VERTEBRAL UNI R MOD SED  07/02/2021  ? IR US GUIDE VASC ACCESS RIGHT  07/02/2021  ? VASECTOMY    ? ? ?There were no vitals filed for this visit. ? ? Subjective Assessment - 08/20/21 1230   ? ? Subjective  I've been driving for about 2 weeks now. The pain in my leg is gone now too.   ? 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 PMH: HLD, HTN, T2DM, h/o CVA (Jan 2023), VAD   ? Limitations Fall risk, ? driving   ? Patient Stated Goals Get back to normal   ? Currently in Pain? No/denies   ? Pain Onset More than a month ago   ? ?  ?  ? ?  ? ?Assessed remaining goals and progress to date - see below.  ? ?Pt reports driving for about 2 weeks - pt encouraged to this discuss w/ MD however pt reports MD said ok to drive as long as he feels comfortable.   ? ?Practiced placing O'Connor pegs in pegboard w/ tweezers w/ no difficulty.  ?Dribbling large ball at slow and faster speeds no difficulty. Ambulating while dribbling ball w/ min difficulty.  ? ? ? ? ? ? ? ? ? ? ? ? ? ? ? ? ? ? ? ? ? ? ? OT Short Term Goals - 07/25/21 1408   ? ?  ? OT SHORT TERM GOAL #1  ? Title Independent with HEP for coordination and strength   ? Time 4   ? Period Weeks   ? Status Achieved   ?  ? OT SHORT TERM GOAL #2  ? Title Pt to improve coordination Rt hand to 30 sec or less   ? Baseline 38 sec   ? Time 4   ? Period Weeks   ? Status Achieved   25.25 sec  ?  ? OT SHORT TERM GOAL #3  ? 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 Baseline 62 lbs   ? Time 4   ? Period Weeks   ?  Status Achieved   84 lbs  ?  ? OT SHORT TERM GOAL #4  ? Title Pt to report greater ease eating with built up utensils prn   ? Time 4   ? Period Weeks   ? Status Achieved   ?  ? OT SHORT TERM GOAL #5  ? Title Pt to write name at 90% or greater legibility   ? Baseline 75%   ? Time 4   ? Period Weeks   ? Status Achieved   ? ?  ?  ? ?  ? ? ? ? OT Long Term Goals - 08/20/21 1233   ? ?  ? OT LONG TERM GOAL #1  ? Title Independent with updated HEP prn   ? Time 8   ? Period Weeks   ? Status Achieved   ?  ? OT LONG TERM GOAL #2  ? Title Improve coordination Rt hand as evidenced by performing 9 hole peg test in 22 sec or less   ? Baseline 38 sec   ? Time 8   ? Period Weeks   ? Status Achieved   21.97 sec  ?  ? OT LONG TERM GOAL #3  ? Title Pt to write 3 sentences maintaining 90% or greater legibility   ? Time 8   ? Period Weeks   ? Status Achieved   ?  ? OT LONG TERM GOAL #4  ? Title Pt to return to leisure activities (playing guitar, drums, and light mechanics) with increased time prn   ? Time 8   ? Period Weeks   ? Status Achieved   Pt reports taking old battery out of car and replacing battery I'ly, practicing drums and guitar  ?  ? OT LONG TERM GOAL #5  ?  Title Pt to return to cooking at [facility] safely w/ distant supervision   ? Time 8   ? Period Weeks   ? Status Achieved   ?  ? OT LONG TERM GOAL #6  ? Title Pt to perform environmental scanning in busy gym w/ simple physical task at 85% or greater accuracy   ? Time 8   ? Period Weeks   ? Status Partially Met   07/25/21: 11/14 on first pass, 07/30/21: 9/15 on first pass, 08/01/21: 10/14 on first pass  ? ?  ?  ? ?  ? ? ? ? ? ? ? ? Plan - 08/20/21 1256   ? ? Clinical Impression Statement Pt has met all goals at this time. Pt is doing very well and has returned to PLOF   ? OT Occupational Profile and History Problem Focused Assessment - Including review of records relating to presenting problem   ? Occupational performance deficits (Please refer to evaluation for details): IADL's;Leisure;ADL's   ? Body Structure / Function / Physical Skills Strength;ADL;Decreased knowledge of use of DME;Dexterity;Balance;UE functional use;IADL;ROM;Coordination;FMC;Sensation   ? Rehab Potential Good   ? Clinical Decision Making Several treatment options, min-mod task modification necessary   ? Comorbidities Affecting Occupational Performance: May have comorbidities impacting occupational performance   ? Modification or Assistance to Complete Evaluation  No modification of tasks or assist necessary to complete eval   ? OT Frequency 2x / week   ? OT Duration 8 weeks   plus evaluation (anticipate only 6 weeks needed)  ? OT Treatment/Interventions Self-care/ADL training;DME and/or AE instruction;Therapeutic activities;Coping strategies training;Therapeutic exercise;Neuromuscular education;Patient/family education;Manual Therapy;Functional Mobility Training;Visual/perceptual remediation/compensation   ? Plan D/C O.T.   ? Consulted and  Agree with Plan of Care Patient   ? ?  ?  ? ?  ? ? ?Patient will benefit from skilled therapeutic intervention in order to improve the following deficits and impairments:   ?Body Structure / Function / Physical Skills:  Strength, ADL, Decreased knowledge of use of DME, Dexterity, Balance, UE functional use, IADL, ROM, Coordination, FMC, Sensation ?  ?  ? ? ?Visit Diagnosis: ?Other lack of coordination ? ?Unsteadiness on feet ? ? ? ?Problem List ?Patient Active Problem List  ? Diagnosis Date Noted  ? History of CVA (cerebrovascular accident) 07/02/2021  ? Vertebral artery disease ([place]) 07/02/2021  ? Obesity (BMI 30-39.9) 07/02/2021  ? Right sided numbness 07/02/2021  ? Right-sided numbness secondary to CVA (cerebral vascular accident) ([place]) 07/01/2021  ? Essential hypertension 06/01/2021  ? Type 2 diabetes mellitus with complication, without long-term current use of insulin ([place]) 06/01/2021  ? Right pontine stroke ([place]) 05/31/2021  ? Hyperglycemia due to type 2 diabetes mellitus ([place]) 06/13/2020  ? Male hypogonadism 06/13/2020  ? Mixed hyperlipidemia 06/13/2020  ? Sciatica 06/13/2020  ? Vitamin D deficiency 06/13/2020  ? Environmental allergies 03/18/2018  ? Recurrent incisional hernia with incarceration 08/05/2012  ? ?OCCUPATIONAL THERAPY DISCHARGE SUMMARY ? ?Visits from Start of Care: 10 ? ?Current functional level related to goals / functional outcomes: ?See above ?  ?Remaining deficits: ?none ?  ?Education / Equipment: ?HEP's  ? ?Patient agrees to discharge. Patient goals were met. Patient is being discharged due to meeting the stated rehab goals.. ? ? ? ?Hans Eden, OT, OTR/L ?08/20/2021, 12:57 PM ? ?[facility] [place] ?[place] ?[place], [place], [address] ?Phone: [phone]   Fax:  [phone] ? ?Name: Joshua Harrington ?MRN: [id number] ?Date of Birth: 05-18-1960 ? ?

## 2021-08-22 ENCOUNTER — Ambulatory Visit: Payer: Commercial Managed Care - HMO | Admitting: Occupational Therapy

## 2021-08-27 ENCOUNTER — Encounter: Payer: Managed Care, Other (non HMO) | Admitting: Occupational Therapy

## 2021-08-29 ENCOUNTER — Encounter: Payer: Managed Care, Other (non HMO) | Admitting: Occupational Therapy

## 2022-02-11 ENCOUNTER — Telehealth: Payer: Self-pay

## 2022-02-12 NOTE — Telephone Encounter (Signed)
Called and spoke to patient he voiced understanding he has a follow up with neurology in Nov I advised patient to give them a call therefore he doesn't have to wait till November

## 2022-02-12 NOTE — Telephone Encounter (Signed)
These are primarily Neurology recommendations after his stroke. I would defer to them. He has not been seen by Neurology after hospital discharge in 06/2021. Recommend follow up with [facility] Neurology.   Thanks MJP

## 2022-03-06 ENCOUNTER — Ambulatory Visit (INDEPENDENT_AMBULATORY_CARE_PROVIDER_SITE_OTHER): Payer: Commercial Managed Care - HMO | Admitting: Podiatry

## 2022-03-06 ENCOUNTER — Encounter: Payer: Self-pay | Admitting: Podiatry

## 2022-03-06 DIAGNOSIS — E119 Type 2 diabetes mellitus without complications: Secondary | ICD-10-CM

## 2022-03-06 DIAGNOSIS — B353 Tinea pedis: Secondary | ICD-10-CM

## 2022-03-06 DIAGNOSIS — M2011 Hallux valgus (acquired), right foot: Secondary | ICD-10-CM | POA: Diagnosis not present

## 2022-03-06 DIAGNOSIS — M2012 Hallux valgus (acquired), left foot: Secondary | ICD-10-CM

## 2022-03-06 DIAGNOSIS — M79675 Pain in left toe(s): Secondary | ICD-10-CM

## 2022-03-06 DIAGNOSIS — B351 Tinea unguium: Secondary | ICD-10-CM | POA: Diagnosis not present

## 2022-03-06 DIAGNOSIS — M2041 Other hammer toe(s) (acquired), right foot: Secondary | ICD-10-CM | POA: Diagnosis not present

## 2022-03-06 DIAGNOSIS — M79674 Pain in right toe(s): Secondary | ICD-10-CM

## 2022-03-06 DIAGNOSIS — M2042 Other hammer toe(s) (acquired), left foot: Secondary | ICD-10-CM

## 2022-03-06 MED ORDER — KETOCONAZOLE 2 % EX CREA: 1.0000 | TOPICAL_CREAM | Freq: Every day | CUTANEOUS | 1 refills | Status: AC

## 2022-03-06 NOTE — Patient Instructions (Signed)
To prevent reinfection, spray shoes with lysol every evening.  Clean tub or shower with bleach based cleanser.   Athlete's Foot Athlete's foot (tinea pedis) is a fungal infection of the skin on your feet. It often occurs on the skin that is between or underneath the toes. It can also occur on the soles of your feet. The infection can spread from person to person (is contagious). It can also spread when a person's bare feet come in contact with the fungus on shower floors or on items such as shoes. What are the causes? This condition is caused by a fungus that grows in warm, moist places. You can get athlete's foot by sharing shoes, shower stalls, towels, and wet floors with someone who is infected. Not washing your feet or changing your socks often enough can also lead to athlete's foot. What increases the risk? This condition is more likely to develop in: Men. People who have a weak body defense system (immune system). People who have diabetes. People who use public showers, such as at a gym. People who wear heavy-duty shoes, such as industrial or military shoes. Seasons with warm, humid weather. What are the signs or symptoms? Symptoms of this condition include: Itchy areas between your toes or on the soles of your feet. White, flaky, or scaly areas between your toes or on the soles of your feet. Very itchy small blisters between your toes or on the soles of your feet. Small cuts in your skin. These cuts can become infected. Thick or discolored toenails. How is this diagnosed? This condition may be diagnosed with a physical exam and a review of your medical history. Your health care provider may also take a skin or toenail sample to examine under a microscope. How is this treated? This condition is treated with antifungal medicines. These may be applied as powders, ointments, or creams. In severe cases, an oral antifungal medicine may be given. Follow these instructions at  home: Medicines Apply or take over-the-counter and prescription medicines only as told by your health care provider. Apply your antifungal medicine as told by your health care provider. Do not stop using the antifungal even if your condition improves. Foot care Do not scratch your feet. Keep your feet dry: Wear cotton or wool socks. Change your socks every day or if they become wet. Wear shoes that allow air to flow, such as sandals or canvas tennis shoes. Wash and dry your feet, including the area between your toes. Also, wash and dry your feet: Every day or as told by your health care provider. After exercising. General instructions Do not let others use towels, shoes, nail clippers, or other personal items that touch your feet. Protect your feet by wearing sandals in wet areas, such as locker rooms and shared showers. Keep all follow-up visits. This is important. If you have diabetes, keep your blood sugar under control. Contact a health care provider if: You have a fever. You have swelling, soreness, warmth, or redness in your foot. Your feet are not getting better with treatment. Your symptoms get worse. You have new symptoms. You have severe pain. Summary Athlete's foot (tinea pedis) is a fungal infection of the skin on your feet. It often occurs on skin that is between or underneath the toes. This condition is caused by a fungus that grows in warm, moist places. Symptoms include white, flaky, or scaly areas between your toes or on the soles of your feet. This condition is treated with antifungal medicines.   Keep your feet clean. Always dry them thoroughly. This information is not intended to replace advice given to you by your health care provider. Make sure you discuss any questions you have with your health care provider. Document Revised: 08/13/2020 Document Reviewed: 08/13/2020 Elsevier Patient Education  2023 [employer]  

## 2022-03-10 NOTE — Progress Notes (Signed)
ANNUAL DIABETIC FOOT EXAM  Subjective: Joshua Harrington presents today for annual diabetic foot examination.  Chief Complaint  Patient presents with   Nail Problem    Diabetic foot care BS-did not check today A1C-6.7 PCP-Osei- Bonsu PCP VST-01/14/2022    Patient confirms h/o diabetes.  Patient relates 5 year h/o diabetes.  Patient denies any h/o foot wounds.  Patient denies any numbness, tingling, burning, or pins/needle sensation in feet.  Risk factors: diabetes, h/o CVA, HTN, hyperlipemia, hypercholesterolemia.  [name], MD is patient's PCP. Last visit was Sep 05, 2021.  Past Medical History:  Diagnosis Date   Diabetes mellitus without complication ([place])    Hernia    High cholesterol    Hypertension    Patient Active Problem List   Diagnosis Date Noted   History of CVA (cerebrovascular accident) 07/02/2021   Vertebral artery disease ([place]) 07/02/2021   Obesity (BMI 30-39.9) 07/02/2021   Right sided numbness 07/02/2021   Right-sided numbness secondary to CVA (cerebral vascular accident) ([place]) 07/01/2021   Essential hypertension 06/01/2021   Type 2 diabetes mellitus with complication, without long-term current use of insulin ([place]) 06/01/2021   Right pontine stroke ([place]) 05/31/2021   Hyperglycemia due to type 2 diabetes mellitus ([place]) 06/13/2020   Male hypogonadism 06/13/2020   Mixed hyperlipidemia 06/13/2020   Sciatica 06/13/2020   Vitamin D deficiency 06/13/2020   Environmental allergies 03/18/2018   Recurrent incisional hernia with incarceration 08/05/2012   Past Surgical History:  Procedure Laterality Date   ABDOMINAL SURGERY     HERNIA REPAIR     IR ANGIO INTRA EXTRACRAN SEL COM CAROTID INNOMINATE BILAT MOD SED  07/02/2021   IR ANGIO VERTEBRAL SEL VERTEBRAL UNI L MOD SED  07/02/2021   IR ANGIO VERTEBRAL SEL VERTEBRAL UNI R MOD SED  07/02/2021   IR US GUIDE VASC ACCESS RIGHT  07/02/2021   VASECTOMY     Current Outpatient Medications on File Prior to  Visit  Medication Sig Dispense Refill   amLODipine-olmesartan (AZOR) 5-20 MG tablet Take 1 tablet by mouth daily.     aspirin EC 81 MG tablet Take 81 mg by mouth daily. Swallow whole.     atorvastatin (LIPITOR) 80 MG tablet Take 1 tablet by mouth daily.     Cholecalciferol (VITAMIN D3) 125 MCG (5000 UT) CAPS Take 1 capsule by mouth daily.     fish oil-omega-3 fatty acids 1000 MG capsule Take 1 g by mouth daily. Take 4 daily     gabapentin (NEURONTIN) 300 MG capsule Take 300 mg by mouth 2 (two) times daily.     metFORMIN (GLUCOPHAGE) 500 MG tablet Take 500 mg by mouth 2 (two) times daily.     Testosterone 25 MG/2.5GM (1%) GEL Apply 1 application topically daily.     ticagrelor (BRILINTA) 90 MG TABS tablet Take 1 tablet (90 mg total) by mouth 2 (two) times daily. 60 tablet 1   Turmeric 450 MG CAPS Take 450 mg by mouth daily.     No current facility-administered medications on file prior to visit.    No Known Allergies Social History   Occupational History   Not on file  Tobacco Use   Smoking status: Never   Smokeless tobacco: Never  Vaping Use   Vaping Use: Never used  Substance and Sexual Activity   Alcohol use: No   Drug use: No   Sexual activity: Not on file   Family History  Problem Relation Age of Onset   Cancer Mother  Stomach   Cirrhosis Father    Alcohol abuse Father    Immunization History  Administered Date(s) Administered   PFIZER(Purple Top)SARS-COV-2 Vaccination 08/21/2019, 09/13/2019    Review of Systems: Negative except as noted in the HPI.   Objective: There were no vitals filed for this visit.  Joshua Harrington is a pleasant 61 y.o. male in NAD. AAO X 3.  Vascular Examination: Capillary refill time immediate b/l.Vascular status intact b/l with palpable pedal pulses. Pedal hair present b/l. No edema. No pain with calf compression b/l. Skin temperature gradient WNL b/l. No cyanosis or clubbing noted b/l LE.  Neurological Examination: Sensation grossly  intact b/l with 10 gram monofilament. Vibratory sensation intact b/l. Pt has subjective symptoms of neuropathy.  Dermatological Examination: Pedal skin with normal turgor, texture and tone b/l. Toenails 1-5 b/l thick, discolored, elongated with subungual debris and pain on dorsal palpation. No hyperkeratotic nor porokeratotic lesions present on today's visit. Diffuse scaling noted peripherally and plantarly b/l feet.  No interdigital macerations.  No blisters, no weeping. No signs of secondary bacterial infection noted.  Musculoskeletal Examination: Normal muscle strength 5/5 to all lower extremity muscle groups bilaterally. Muscle strength 5/5 to all lower extremity muscle groups bilaterally. HAV with bunion deformity noted b/l LE. Hammertoe deformity noted 2-5 b/l.Marland Kitchen No pain, crepitus or joint limitation noted with ROM b/l LE.  Patient ambulates independently without assistive aids.  Radiographs: None  Last A1c:      Latest Ref Rng & Units 07/02/2021    8:31 AM  Hemoglobin A1C  Hemoglobin-A1c 4.8 - 5.6 % 6.4    Footwear Assessment: Does the patient wear appropriate shoes? Yes. Does the patient need inserts/orthotics? No.  ADA Risk Categorization: Low Risk :  Patient has all of the following: Intact protective sensation No prior foot ulcer  No severe deformity Pedal pulses present  Assessment: 1. Pain due to onychomycosis of toenails of both feet   2. Tinea pedis of both feet   3. Hallux valgus, acquired, bilateral   4. Acquired hammertoes of both feet   5. Controlled type 2 diabetes mellitus without complication, without long-term current use of insulin (HCC)   6. Encounter for diabetic foot exam (HCC)     Plan: -Consent given for treatment as described below: -Examined patient. -Diabetic foot examination performed today. -Continue diabetic foot care principles: inspect feet daily, monitor glucose as recommended by PCP and/or Endocrinologist, and follow prescribed diet per  PCP, Endocrinologist and/or dietician. -Patient to continue soft, supportive shoe gear daily. -Toenails 1-5 b/l were debrided in length and girth with sterile nail nippers and dremel without iatrogenic bleeding.  -For tinea pedis, Rx sent to pharmacy for Ketoconazole Cream 2% to be applied once daily for six weeks. -Patient/POA to call should there be question/concern in the interim. Return in about 3 months (around 06/06/2022).  Freddie Breech, DPM

## 2022-07-02 ENCOUNTER — Ambulatory Visit (INDEPENDENT_AMBULATORY_CARE_PROVIDER_SITE_OTHER): Payer: Commercial Managed Care - HMO | Admitting: Podiatry

## 2022-07-02 VITALS — BP 136/77

## 2022-07-02 DIAGNOSIS — E119 Type 2 diabetes mellitus without complications: Secondary | ICD-10-CM | POA: Diagnosis not present

## 2022-07-02 DIAGNOSIS — M79675 Pain in left toe(s): Secondary | ICD-10-CM | POA: Diagnosis not present

## 2022-07-02 DIAGNOSIS — M79674 Pain in right toe(s): Secondary | ICD-10-CM

## 2022-07-02 DIAGNOSIS — B351 Tinea unguium: Secondary | ICD-10-CM

## 2022-07-02 NOTE — Progress Notes (Unsigned)
  Subjective:  Patient ID: Joshua Harrington, male    DOB: [birth year],  MRN: [id number]  Joshua Harrington presents to clinic today for preventative diabetic foot care and painful thick toenails that are difficult to trim. Pain interferes with ambulation. Aggravating factors include wearing enclosed shoe gear. Pain is relieved with periodic professional debridement.  Chief Complaint  Patient presents with   Nail Problem    DFC BS-110 A1C- PCP-Osai-Bonsu PCP VST-06/03/2022   New problem(s): None. {[id number]}  PCP is Osei-Bonsu, Iona Beard, MD.  No Known Allergies  Review of Systems: Negative except as noted in the HPI.  Objective: No changes noted in today's physical examination. Vitals:   07/02/22 1357  BP: 136/77   Joshua Harrington is a pleasant 62 y.o. male WD, WN in NAD. AAO x 3.  Vascular Examination: Capillary refill time immediate b/l.Vascular status intact b/l with palpable pedal pulses. Pedal hair present b/l. No edema. No pain with calf compression b/l. Skin temperature gradient WNL b/l. No cyanosis or clubbing noted b/l LE.  Neurological Examination: Sensation grossly intact b/l with 10 gram monofilament. Vibratory sensation intact b/l. Pt has subjective symptoms of neuropathy.  Dermatological Examination: Pedal skin with normal turgor, texture and tone b/l. Toenails 1-5 b/l thick, discolored, elongated with subungual debris and pain on dorsal palpation. No hyperkeratotic nor porokeratotic lesions present on today's visit. Diffuse scaling noted peripherally and plantarly b/l feet.  No interdigital macerations.  No blisters, no weeping. No signs of secondary bacterial infection noted.  Musculoskeletal Examination: Normal muscle strength 5/5 to all lower extremity muscle groups bilaterally. Muscle strength 5/5 to all lower extremity muscle groups bilaterally. HAV with bunion deformity noted b/l LE. Hammertoe deformity noted 2-5 b/l.Marland Kitchen No pain, crepitus or joint limitation noted with ROM  b/l LE.  Patient ambulates independently without assistive aids.  Radiographs: None  Assessment/Plan: 1. Pain due to onychomycosis of toenails of both feet   2. Controlled type 2 diabetes mellitus without complication, without long-term current use of insulin ([place])     No orders of the defined types were placed in this encounter.   None -Patient was evaluated and treated. All patient's and/or POA's questions/concerns answered on today's visit. -Continue foot and shoe inspections daily. Monitor blood glucose per PCP/Endocrinologist's recommendations. -Continue supportive shoe gear daily. -Mycotic toenails 1-5 bilaterally debrided in length and girth with sterile nail nippers without iatrogenic bleeding. Patient declined use of dremel. -Patient/POA to call should there be question/concern in the interim.   Return in about 3 months (around 09/30/2022).  Marzetta Board, DPM

## 2022-07-03 ENCOUNTER — Ambulatory Visit: Payer: Commercial Managed Care - HMO | Admitting: Podiatry

## 2022-07-04 ENCOUNTER — Encounter: Payer: Self-pay | Admitting: Podiatry

## 2022-10-22 ENCOUNTER — Ambulatory Visit (INDEPENDENT_AMBULATORY_CARE_PROVIDER_SITE_OTHER): Payer: Commercial Managed Care - HMO | Admitting: Podiatry

## 2022-10-22 ENCOUNTER — Encounter: Payer: Self-pay | Admitting: Podiatry

## 2022-10-22 VITALS — BP 123/74 | HR 97

## 2022-10-22 DIAGNOSIS — M79675 Pain in left toe(s): Secondary | ICD-10-CM

## 2022-10-22 DIAGNOSIS — B351 Tinea unguium: Secondary | ICD-10-CM

## 2022-10-22 DIAGNOSIS — M79674 Pain in right toe(s): Secondary | ICD-10-CM

## 2022-10-22 DIAGNOSIS — E119 Type 2 diabetes mellitus without complications: Secondary | ICD-10-CM | POA: Diagnosis not present

## 2022-10-27 NOTE — Progress Notes (Signed)
  Subjective:  Patient ID: Joshua Harrington, male    DOB: 01-14-1961,  MRN: [id number]  Joshua Harrington presents to clinic today for: preventative diabetic foot care and painful elongated mycotic toenails 1-5 bilaterally which are tender when wearing enclosed shoe gear. Pain is relieved with periodic professional debridement.  Chief Complaint  Patient presents with   Diabetes    [facility] Blood Sugar (AM)- 125 A1C-7.0  Last ov with PCP- 09/09/22    PCP is Osei-Bonsu, Greggory Stallion, MD.  No Known Allergies  Review of Systems: Negative except as noted in the HPI.  Objective: No changes noted in today's physical examination. Vitals:   10/22/22 1412  BP: 123/74  Pulse: 97  SpO2: 99%    Joshua Harrington is a pleasant 62 y.o. male in NAD. AAO x 3.  Vascular Examination: Capillary refill time <3 seconds b/l LE. Palpable pedal pulses b/l LE. Digital hair present b/l. No pedal edema b/l. Skin temperature gradient WNL b/l. No varicosities b/l. No cyanosis or clubbing noted b/l LE.Marland Kitchen  Dermatological Examination: Pedal skin with normal turgor, texture and tone b/l. No open wounds. No interdigital macerations b/l. Toenails 1-5 b/l thickened, discolored, dystrophic with subungual debris. There is pain on palpation to dorsal aspect of nailplates. No hyperkeratotic nor porokeratotic lesions present on today's visit.Marland Kitchen  Neurological Examination: Protective sensation intact with 10 gram monofilament b/l LE. Vibratory sensation intact b/l LE. Pt has subjective symptoms of neuropathy.  Musculoskeletal Examination: Muscle strength 5/5 to all lower extremity muscle groups bilaterally. HAV with bunion bilaterally and hammertoes 2-5 b/l. Utilizes cane for ambulation assistance.  Assessment/Plan: 1. Pain due to onychomycosis of toenails of both feet   2. Controlled type 2 diabetes mellitus without complication, without long-term current use of insulin (HCC)    -Consent given for treatment as described below: -Examined  patient. -Patient to continue soft, supportive shoe gear daily. -Toenails 1-5 b/l were debrided in length and girth with sterile nail nippers and dremel without iatrogenic bleeding.  -Patient/POA to call should there be question/concern in the interim.   Return in about 3 months (around 01/22/2023).  Freddie Breech, DPM

## 2023-02-05 ENCOUNTER — Ambulatory Visit (INDEPENDENT_AMBULATORY_CARE_PROVIDER_SITE_OTHER): Payer: Managed Care, Other (non HMO) | Admitting: Podiatry

## 2023-02-05 DIAGNOSIS — M79675 Pain in left toe(s): Secondary | ICD-10-CM

## 2023-02-05 DIAGNOSIS — E119 Type 2 diabetes mellitus without complications: Secondary | ICD-10-CM | POA: Diagnosis not present

## 2023-02-05 DIAGNOSIS — M79674 Pain in right toe(s): Secondary | ICD-10-CM | POA: Diagnosis not present

## 2023-02-05 DIAGNOSIS — B351 Tinea unguium: Secondary | ICD-10-CM

## 2023-02-05 NOTE — Progress Notes (Signed)
  Subjective:  Patient ID: Joshua Harrington, male    DOB: 04/05/1961,  MRN: [id number]  Joshua Harrington presents to clinic today for: preventative diabetic foot care and painful thick toenails that are difficult to trim. Pain interferes with ambulation. Aggravating factors include wearing enclosed shoe gear. Pain is relieved with periodic professional debridement.  Chief Complaint  Patient presents with   RFC    RFC    PCP is Osei-Bonsu, Greggory Stallion, MD. Joshua Harrington 09/09/2022.  No Known Allergies  Review of Systems: Negative except as noted in the HPI.  Objective: No changes noted in today's physical examination. There were no vitals filed for this visit.  Joshua Harrington is a pleasant 62 y.o. male in NAD. AAO x 3.  Vascular Examination: Capillary refill time <3 seconds b/l LE. Palpable pedal pulses b/l LE. Digital hair present b/l. No pedal edema b/l. Skin temperature gradient WNL b/l. No varicosities b/l. No ischemia or gangrene noted b/l LE. No cyanosis or clubbing noted b/l LE.Marland Kitchen  Dermatological Examination: Pedal skin with normal turgor, texture and tone b/l. No open wounds. No interdigital macerations b/l. Toenails 1-5 b/l thickened, discolored, dystrophic with subungual debris. There is pain on palpation to dorsal aspect of nailplates. No corns, calluses nor porokeratotic lesions noted..  Neurological Examination: Protective sensation intact with 10 gram monofilament b/l LE. Vibratory sensation intact b/l LE. Pt has subjective symptoms of neuropathy.  Musculoskeletal Examination: Muscle strength 5/5 to all lower extremity muscle groups bilaterally. HAV with bunion deformity noted b/l LE. Hammertoe deformity noted 2-5 b/l. Utilizes cane for gait assistance.  Assessment/Plan: 1. Pain due to onychomycosis of toenails of both feet   2. Controlled type 2 diabetes mellitus without complication, without long-term current use of insulin (HCC)     -Patient was evaluated and treated. All patient's and/or POA's  questions/concerns answered on today's visit. -No new findings. No new orders. -Continue foot and shoe inspections daily. Monitor blood glucose per PCP/Endocrinologist's recommendations. -Continue supportive shoe gear daily. -Mycotic toenails 1-5 bilaterally were debrided in length and girth with sterile nail nippers and dremel without incident. -Patient/POA to call should there be question/concern in the interim.   Return in about 3 months (around 05/08/2023).  Joshua Harrington, DPM

## 2023-02-09 ENCOUNTER — Encounter: Payer: Self-pay | Admitting: Podiatry

## 2023-05-21 ENCOUNTER — Encounter: Payer: Self-pay | Admitting: Podiatry

## 2023-05-21 ENCOUNTER — Ambulatory Visit (INDEPENDENT_AMBULATORY_CARE_PROVIDER_SITE_OTHER): Payer: Commercial Managed Care - HMO | Admitting: Podiatry

## 2023-05-21 VITALS — Ht 67.0 in | Wt 197.0 lb

## 2023-05-21 DIAGNOSIS — M2011 Hallux valgus (acquired), right foot: Secondary | ICD-10-CM | POA: Diagnosis not present

## 2023-05-21 DIAGNOSIS — M79675 Pain in left toe(s): Secondary | ICD-10-CM

## 2023-05-21 DIAGNOSIS — M2012 Hallux valgus (acquired), left foot: Secondary | ICD-10-CM | POA: Diagnosis not present

## 2023-05-21 DIAGNOSIS — E119 Type 2 diabetes mellitus without complications: Secondary | ICD-10-CM

## 2023-05-21 DIAGNOSIS — M79674 Pain in right toe(s): Secondary | ICD-10-CM | POA: Diagnosis not present

## 2023-05-21 DIAGNOSIS — M2041 Other hammer toe(s) (acquired), right foot: Secondary | ICD-10-CM

## 2023-05-21 DIAGNOSIS — B351 Tinea unguium: Secondary | ICD-10-CM

## 2023-05-21 DIAGNOSIS — M2042 Other hammer toe(s) (acquired), left foot: Secondary | ICD-10-CM

## 2023-05-30 NOTE — Progress Notes (Signed)
Subjective:  Patient ID: Joshua Harrington, male    DOB: 1961-02-12,  MRN: [id number]  63 y.o. male presents to clinic with  for annual diabetic foot examination, at risk foot care with history of diabetic neuropathy, and painful elongated mycotic toenails 1-5 bilaterally which are tender when wearing enclosed shoe gear. Pain is relieved with periodic professional debridement.  Chief Complaint  Patient presents with   RFC    He is here for nail trim, PCP is dr Leanor Kail, last ov was 10/24   New problem(s): None   PCP is Osei-Bonsu, Greggory Stallion, MD.  No Known Allergies  Review of Systems: Negative except as noted in the HPI.   Objective:  Christan Defranco is a pleasant 63 y.o. male in NAD. AAO x 3.   Title   Diabetic Foot Exam - detailed Date & Time: 05/21/2023  2:45 PM Diabetic Foot exam was performed with the following findings: Yes  Visual Foot Exam completed.: Yes  Is there a history of foot ulcer?: No Is there a foot ulcer now?: No Is there swelling?: No Is there elevated skin temperature?: No Is there abnormal foot shape?: Yes (Comment: HAV with bunion b/l and hammertoes 2-5 b/l) Is there a claw toe deformity?: No Are the toenails long?: Yes Are the toenails thick?: Yes Are the toenails ingrown?: No Is the skin thin, fragile, shiny and hairless?": No Normal Range of Motion?: Yes Is there foot or ankle muscle weakness?: No Do you have pain in calf while walking?: No Are the shoes appropriate in style and fit?: Yes Can the patient see the bottom of their feet?: No Pulse Foot Exam completed.: Yes   Right Posterior Tibialis: Present Left posterior Tibialis: Present   Right Dorsalis Pedis: Present Left Dorsalis Pedis: Present     Sensory Foot Exam Completed.: Yes Semmes-Weinstein Monofilament Test "+" means "has sensation" and "-" means "no sensation"   R Site 1-Great Toe: Pos L Site 1-Great Toe: Pos   R Site 4: Pos L Site 4: Pos   R site 5: Pos L Site 5: Pos  R Site 6: Pos L Site 6:  Pos     Image components are not supported.   Image components are not supported. Image components are not supported.  [place] Right vibratory: present Left vibratory: present  Comments Pt has subjective symptoms of neuropathy.      Radiographs: None  Assessment:   1. Pain due to onychomycosis of toenails of both feet   2. Hallux valgus, acquired, bilateral   3. Acquired hammertoes of both feet   4. Controlled type 2 diabetes mellitus without complication, without long-term current use of insulin (HCC)   5. Encounter for diabetic foot exam [facility])    Plan:  -Patient was evaluated today. All questions/concerns addressed on today's visit. -Diabetic foot examination performed today. -Continue diabetic foot care principles: inspect feet daily, monitor glucose as recommended by PCP and/or Endocrinologist, and follow prescribed diet per PCP, Endocrinologist and/or dietician. -Continue supportive shoe gear daily. -Toenails 1-5 b/l were debrided in length and girth with sterile nail nippers and dremel without iatrogenic bleeding.  -Patient/POA to call should there be question/concern in the interim.  Return in about 3 months (around 08/19/2023).  Freddie Breech, DPM      [place] LOCATION: [address]  [place], [place] [address]                   Office [phone]   [facility] LOCATION: [address] [place], [place] [address] Office [phone]

## 2023-09-10 ENCOUNTER — Ambulatory Visit (INDEPENDENT_AMBULATORY_CARE_PROVIDER_SITE_OTHER): Payer: Commercial Managed Care - HMO | Admitting: Podiatry

## 2023-09-10 ENCOUNTER — Encounter: Payer: Self-pay | Admitting: Podiatry

## 2023-09-10 DIAGNOSIS — M79675 Pain in left toe(s): Secondary | ICD-10-CM | POA: Diagnosis not present

## 2023-09-10 DIAGNOSIS — E119 Type 2 diabetes mellitus without complications: Secondary | ICD-10-CM

## 2023-09-10 DIAGNOSIS — B351 Tinea unguium: Secondary | ICD-10-CM | POA: Diagnosis not present

## 2023-09-10 DIAGNOSIS — M79674 Pain in right toe(s): Secondary | ICD-10-CM | POA: Diagnosis not present

## 2023-09-14 NOTE — Progress Notes (Signed)
  Subjective:  Patient ID: Joshua Harrington, male    DOB: 1960/12/19,  MRN: [id number]  63 y.o. male presents preventative diabetic foot care and painful elongated mycotic toenails 1-5 bilaterally which are tender when wearing enclosed shoe gear. Pain is relieved with periodic professional debridement.  New problem(s): None   PCP is Osei-Bonsu, Caretha Chapel, MD.  No Known Allergies  Review of Systems: Negative except as noted in the HPI.   Objective:  Joshua Harrington is a pleasant 63 y.o. male WD, WN in NAD. AAO x 3.  Vascular Examination: Vascular status intact b/l with palpable pedal pulses. CFT immediate b/l. Pedal hair present. No edema. No pain with calf compression b/l. Skin temperature gradient WNL b/l. No varicosities noted. No cyanosis or clubbing noted.  Neurological Examination: Pt has subjective symptoms of neuropathy. Sensation grossly intact b/l with 10 gram monofilament. Vibratory sensation intact b/l.  Dermatological Examination: Pedal skin with normal turgor, texture and tone b/l. No open wounds nor interdigital macerations noted. Toenails 1-5 b/l thick, discolored, elongated with subungual debris and pain on dorsal palpation. No hyperkeratotic lesions noted b/l.   Musculoskeletal Examination: Muscle strength 5/5 to b/l LE.  No pain, crepitus noted b/l. No gross pedal deformities. Patient ambulates with cane assistance.  Radiographs: None  Last A1c:       No data to display           Assessment:   1. Pain due to onychomycosis of toenails of both feet   2. Controlled type 2 diabetes mellitus without complication, without long-term current use of insulin  [facility])     Plan:  Consent given for treatment. Patient examined. All patient's and/or POA's questions/concerns addressed on today's visit. Mycotic toenails 1-5 debrided in length and girth without incident. Continue soft, supportive shoe gear daily. Report any pedal injuries to medical professional. Call office if there are  any quesitons/concerns. -Patient/POA to call should there be question/concern in the interim.  Return in about 3 months (around 12/11/2023).  Luella Sager, DPM      [place] LOCATION: [address], [place] [address]                   Office [phone]   [facility] LOCATION: [address] [place], [place] [address] Office [phone]

## 2023-12-23 ENCOUNTER — Ambulatory Visit: Admitting: Podiatry

## 2023-12-23 ENCOUNTER — Encounter: Payer: Self-pay | Admitting: Podiatry

## 2023-12-23 DIAGNOSIS — B351 Tinea unguium: Secondary | ICD-10-CM | POA: Diagnosis not present

## 2023-12-23 DIAGNOSIS — M79674 Pain in right toe(s): Secondary | ICD-10-CM | POA: Diagnosis not present

## 2023-12-23 DIAGNOSIS — E119 Type 2 diabetes mellitus without complications: Secondary | ICD-10-CM

## 2023-12-23 DIAGNOSIS — M79675 Pain in left toe(s): Secondary | ICD-10-CM | POA: Diagnosis not present

## 2023-12-28 DIAGNOSIS — Z125 Encounter for screening for malignant neoplasm of prostate: Secondary | ICD-10-CM | POA: Insufficient documentation

## 2023-12-28 DIAGNOSIS — I63239 Cerebral infarction due to unspecified occlusion or stenosis of unspecified carotid arteries: Secondary | ICD-10-CM | POA: Insufficient documentation

## 2023-12-28 NOTE — Progress Notes (Signed)
  Subjective:  Patient ID: Joshua Harrington, male    DOB: May 12, 1960,  MRN: [id number]  [name] presents to clinic today for preventative diabetic foot care for painful mycotic toenails of both feet that are difficult to trim. Pain interferes with daily activities and wearing enclosed shoe gear comfortably.   New problem(s): None.   PCP is Catalina Bare, MD. [place] 09/23/2023.  No Known Allergies  Review of Systems: Negative except as noted in the HPI.  Objective: No changes noted in today's physical examination. There were no vitals filed for this visit. Karlon Schlafer is a pleasant 63 y.o. male in NAD. AAO x 3.  Vascular Examination: Capillary refill time immediate b/l. Palpable pedal pulses. Pedal hair present b/l. Pedal edema absent. No pain with calf compression b/l. Skin temperature gradient WNL b/l. No cyanosis or clubbing b/l. No ischemia or gangrene noted b/l.   Neurological Examination: Sensation grossly intact b/l with 10 gram monofilament. Vibratory sensation intact b/l. Pt has subjective symptoms of neuropathy.  Dermatological Examination: Pedal skin with normal turgor, texture and tone b/l.  No open wounds. No interdigital macerations.   Toenails 1-5 b/l thick, discolored, elongated with subungual debris and pain on dorsal palpation.   No corns, calluses, nor porokeratotic lesions.  Musculoskeletal Examination: Muscle strength 5/5 to all lower extremity muscle groups bilaterally. No pain, crepitus or joint limitation noted with ROM b/l LE. No gross bony pedal deformities b/l. Patient ambulates with cane assistance.  Radiographs: None  Assessment/Plan: 1. Pain due to onychomycosis of toenails of both feet   2. Controlled type 2 diabetes mellitus without complication, without long-term current use of insulin  [facility])    Patient was evaluated and treated. All patient's and/or POA's questions/concerns addressed on today's visit. Toenails 1-5 debrided in length and girth  without incident. Continue foot and shoe inspections daily. Monitor blood glucose per PCP/Endocrinologist's recommendations. Continue soft, supportive shoe gear daily. Report any pedal injuries to medical professional. Call office if there are any questions/concerns. -Patient/POA to call should there be question/concern in the interim.   Return in about 3 months (around 03/24/2024).  Delon LITTIE Merlin, DPM      [place] LOCATION: [address] [place] [address]                   Office [phone]   [facility] LOCATION: [address] [place], [place] [address] Office [phone]

## 2024-04-07 ENCOUNTER — Encounter: Payer: Self-pay | Admitting: Podiatry

## 2024-04-07 ENCOUNTER — Ambulatory Visit: Admitting: Podiatry

## 2024-04-07 DIAGNOSIS — M79675 Pain in left toe(s): Secondary | ICD-10-CM

## 2024-04-07 DIAGNOSIS — B351 Tinea unguium: Secondary | ICD-10-CM

## 2024-04-07 DIAGNOSIS — L853 Xerosis cutis: Secondary | ICD-10-CM | POA: Diagnosis not present

## 2024-04-07 DIAGNOSIS — E119 Type 2 diabetes mellitus without complications: Secondary | ICD-10-CM | POA: Diagnosis not present

## 2024-04-07 DIAGNOSIS — M79674 Pain in right toe(s): Secondary | ICD-10-CM | POA: Diagnosis not present

## 2024-04-07 MED ORDER — AMMONIUM LACTATE 12 % EX LOTN: 1.0000 | TOPICAL_LOTION | CUTANEOUS | 5 refills | Status: AC | PRN

## 2024-04-11 ENCOUNTER — Encounter: Payer: Self-pay | Admitting: Podiatry

## 2024-04-11 NOTE — Progress Notes (Signed)
  Subjective:  Patient ID: Jevin Camino, male    DOB: 25-May-1960,  MRN: [id number]  Coden Franchi presents to clinic today for preventative diabetic foot care for painful mycotic toenails x 10 which interfere with daily activities. Pain is relieved with periodic professional debridement.  Chief Complaint  Patient presents with   [facility]    Rm16 Diabetic foot care/ A1c 6.7/ Dr. Zachary Rave Bonsu last visit    New problem(s): None.   PCP is Catalina Zachary, MD.  ARNETTA 03/15/24.  No Known Allergies  Review of Systems: Negative except as noted in the HPI.  Objective:  There were no vitals filed for this visit. Eulon Allnutt is a pleasant 63 y.o. male WD, WN in NAD. AAO x 3.  Vascular Examination: Capillary refill time immediate b/l. Palpable pedal pulses. Pedal hair present b/l. Pedal edema absent. No pain with calf compression b/l. Skin temperature gradient WNL b/l. No cyanosis or clubbing b/l. No ischemia or gangrene noted b/l.   Neurological Examination: Sensation grossly intact b/l with 10 gram monofilament. Vibratory sensation intact b/l. Pt has subjective symptoms of neuropathy.  Dermatological Examination: Pedal skin dry b/l.  No open wounds. No interdigital macerations.   Toenails 1-5 b/l thick, discolored, elongated with subungual debris and pain on dorsal palpation.   No corns, calluses, nor porokeratotic lesions.  Musculoskeletal Examination: Muscle strength 5/5 to all lower extremity muscle groups bilaterally. No pain, crepitus or joint limitation noted with ROM b/l LE. No gross bony pedal deformities b/l. Patient ambulates with cane assistance.  Radiographs: None  Assessment/Plan: 1. Pain due to onychomycosis of toenails of both feet   2. Xerosis cutis   3. Controlled type 2 diabetes mellitus without complication, without long-term current use of insulin  (HCC)     Meds ordered this encounter  Medications   ammonium lactate  (LAC-HYDRIN ) 12 % lotion    Sig: Apply 1  Application topically as needed for dry skin.    Dispense:  400 g    Refill:  5  Patient was evaluated and treated. All patient's and/or POA's questions/concerns addressed on today's visit. Toenails 1-5 b/l debrided in length and girth without incident. Treatment was provided by assistant Mable FABIENE Cherry under my supervision. Continue foot and shoe inspections daily. Monitor blood glucose per PCP/Endocrinologist's recommendations. Continue soft, supportive shoe gear daily. Report any pedal injuries to medical professional. Call office if there are any questions/concerns. For xerosis, Rx sent for Ammonium Lactate  Lotion 12%. Apply to feet twice daily avoiding application between toes. -Patient/POA to call should there be question/concern in the interim.   Return in about 3 months (around 07/06/2024).  Delon LITTIE Merlin, DPM      [place] LOCATION: [address], [place] [address]                   Office [phone]   [facility] LOCATION: [address] [place], [place] [address] Office [phone]

## 2024-07-27 ENCOUNTER — Ambulatory Visit: Admitting: Podiatry
